# Patient Record
Sex: Male | Born: 1980 | Race: White | Hispanic: No | Marital: Single | Smoking: Current every day smoker
Health system: Southern US, Community
[De-identification: ages and names within clinical notes are randomized; demographics above are authoritative.]

## PROBLEM LIST (undated history)

## (undated) DIAGNOSIS — F32A Depression, unspecified: Secondary | ICD-10-CM

## (undated) DIAGNOSIS — N289 Disorder of kidney and ureter, unspecified: Secondary | ICD-10-CM

## (undated) DIAGNOSIS — F329 Major depressive disorder, single episode, unspecified: Secondary | ICD-10-CM

## (undated) DIAGNOSIS — M25519 Pain in unspecified shoulder: Secondary | ICD-10-CM

## (undated) HISTORY — PX: NO PAST SURGERIES: SHX2092

---

## 2006-04-10 ENCOUNTER — Emergency Department: Payer: Self-pay | Admitting: Emergency Medicine

## 2006-07-17 ENCOUNTER — Emergency Department: Payer: Self-pay | Admitting: Emergency Medicine

## 2006-10-05 ENCOUNTER — Emergency Department: Payer: Self-pay | Admitting: Emergency Medicine

## 2006-10-07 ENCOUNTER — Emergency Department: Payer: Self-pay | Admitting: Emergency Medicine

## 2013-04-09 ENCOUNTER — Emergency Department: Payer: Self-pay

## 2013-04-09 LAB — CBC
HCT: 45.3 % (ref 40.0–52.0)
HGB: 15.4 g/dL (ref 13.0–18.0)
MCH: 32.7 pg (ref 26.0–34.0)
MCHC: 34 g/dL (ref 32.0–36.0)
MCV: 96 fL (ref 80–100)
Platelet: 146 10*3/uL — ABNORMAL LOW (ref 150–440)
RBC: 4.71 10*6/uL (ref 4.40–5.90)

## 2013-04-09 LAB — COMPREHENSIVE METABOLIC PANEL
Alkaline Phosphatase: 87 U/L (ref 50–136)
BUN: 12 mg/dL (ref 7–18)
Calcium, Total: 8.8 mg/dL (ref 8.5–10.1)
Co2: 29 mmol/L (ref 21–32)
EGFR (African American): >60
Glucose: 98 mg/dL (ref 65–99)
Osmolality: 285 (ref 275–301)
SGPT (ALT): 20 U/L (ref 12–78)
Sodium: 143 mmol/L (ref 136–145)

## 2013-04-09 LAB — ETHANOL: Ethanol: 165 mg/dL

## 2013-04-10 LAB — DRUG SCREEN, URINE
Amphetamines, Ur Screen: NEGATIVE (ref ?–1000)
Barbiturates, Ur Screen: NEGATIVE (ref ?–200)
Benzodiazepine, Ur Scrn: NEGATIVE (ref ?–200)
Cocaine Metabolite,Ur ~~LOC~~: POSITIVE (ref ?–300)
Methadone, Ur Screen: NEGATIVE (ref ?–300)
Opiate, Ur Screen: POSITIVE (ref ?–300)
Phencyclidine (PCP) Ur S: NEGATIVE (ref ?–25)

## 2013-04-10 LAB — URINALYSIS, COMPLETE
Bacteria: NONE SEEN
Bilirubin,UR: NEGATIVE
Glucose,UR: NEGATIVE mg/dL (ref 0–75)
Ketone: NEGATIVE
Leukocyte Esterase: NEGATIVE
Ph: 7 (ref 4.5–8.0)
Protein: NEGATIVE
Squamous Epithelial: NONE SEEN

## 2013-05-07 ENCOUNTER — Emergency Department: Payer: Self-pay | Admitting: Emergency Medicine

## 2013-05-10 LAB — BETA STREP CULTURE(ARMC)

## 2013-05-20 ENCOUNTER — Emergency Department: Payer: Self-pay | Admitting: Internal Medicine

## 2013-12-13 ENCOUNTER — Emergency Department: Payer: Self-pay | Admitting: Emergency Medicine

## 2014-01-21 ENCOUNTER — Emergency Department: Payer: Self-pay | Admitting: Emergency Medicine

## 2014-01-21 LAB — CBC WITH DIFFERENTIAL/PLATELET
Basophil #: 0 10*3/uL (ref 0.0–0.1)
Basophil %: 0.9 %
EOS PCT: 2.6 %
Eosinophil #: 0.1 10*3/uL (ref 0.0–0.7)
HCT: 42.7 % (ref 40.0–52.0)
HGB: 14.3 g/dL (ref 13.0–18.0)
Lymphocyte #: 1.8 10*3/uL (ref 1.0–3.6)
Lymphocyte %: 37 %
MCH: 31.7 pg (ref 26.0–34.0)
MCHC: 33.4 g/dL (ref 32.0–36.0)
MCV: 95 fL (ref 80–100)
MONO ABS: 0.4 x10 3/mm (ref 0.2–1.0)
Monocyte %: 7.8 %
NEUTROS PCT: 51.7 %
Neutrophil #: 2.5 10*3/uL (ref 1.4–6.5)
PLATELETS: 145 10*3/uL — AB (ref 150–440)
RBC: 4.5 10*6/uL (ref 4.40–5.90)
RDW: 14 % (ref 11.5–14.5)
WBC: 4.8 10*3/uL (ref 3.8–10.6)

## 2014-01-21 LAB — COMPREHENSIVE METABOLIC PANEL
ALK PHOS: 102 U/L
ANION GAP: 3 — AB (ref 7–16)
Albumin: 4 g/dL (ref 3.4–5.0)
BILIRUBIN TOTAL: 0.7 mg/dL (ref 0.2–1.0)
BUN: 12 mg/dL (ref 7–18)
CO2: 28 mmol/L (ref 21–32)
Calcium, Total: 9.3 mg/dL (ref 8.5–10.1)
Chloride: 107 mmol/L (ref 98–107)
Creatinine: 0.97 mg/dL (ref 0.60–1.30)
EGFR (Non-African Amer.): >60
GLUCOSE: 99 mg/dL (ref 65–99)
OSMOLALITY: 275 (ref 275–301)
Potassium: 3.9 mmol/L (ref 3.5–5.1)
SGOT(AST): 20 U/L (ref 15–37)
SGPT (ALT): 26 U/L
Sodium: 138 mmol/L (ref 136–145)
TOTAL PROTEIN: 7.5 g/dL (ref 6.4–8.2)

## 2014-01-21 LAB — LIPASE, BLOOD: Lipase: 82 U/L (ref 73–393)

## 2014-04-09 ENCOUNTER — Emergency Department: Payer: Self-pay | Admitting: Internal Medicine

## 2014-05-29 ENCOUNTER — Emergency Department: Payer: Self-pay | Admitting: Emergency Medicine

## 2014-07-01 ENCOUNTER — Emergency Department: Payer: Self-pay | Admitting: Emergency Medicine

## 2014-10-04 NOTE — Consult Note (Signed)
PATIENT NAME:  [name] Griffin, [name] MR#:  [id number] DATE OF BIRTH:  August 09, 1980  DATE OF CONSULTATION:  04/10/2013  REFERRING PHYSICIAN:   CONSULTING PHYSICIAN:  Josef Tourigny S. Chozen Latulippe, MD  REASON FOR CONSULTATION: I fell off the roof, I want to go home.   HISTORY OF PRESENT ILLNESS: The patient is a 34 year old male who presented to the ED after he fell off the roof and stated that he does not want to hurt himself or anybody else. He was brought in to the ED by the EMS at 8:00 p.m. last night. Per the patient, he was working on roofing and siding and then he fell off the roof yesterday at work. He reported that he went to the restaurant and got loaded with his cousin. He claims that he might have had a seizure or fell at home. His family called 911. At presentation the patient was intoxicated with a BAL of 165. He was also positive for cocaine, opioids and THC. He was very irritated during my interview and reported that he has already spoken to the ED physician and does not want to talk to me anymore. He stated "this is F bullshit". He stated that "you cannot hold me here and this is pissing me off.  I had a seizure as I got drunk and I cannot stay here any longer." He stated that he just wanted to leave the hospital. He was very irritable and did not participate much in the interview. Most of the history is obtained from the records.   According to the history, the patient currently works in roofing and siding and he mentioned to the intake person that he cannot lose his job as he has 4 children to support. He also has history of overdose as a teen and [facility]. He is not seeing a psychiatrist or a therapist. The patient has demonstrated poor insight and was very irritable upon presentation to the ED and has to be medicated to control his behavior.   PAST PSYCHIATRIC HISTORY: The patient has long history of using drugs and alcohol. He started using alcohol and has been using margaritas and  beer. He usually consumes a 6 pack over the weekend. It is not known if the patient has any history of blackouts. However, the patient had a seizure which led to this admission. He also uses cocaine as well as marijuana. He was minimizing his use of drugs at this time.   PAST MEDICAL HISTORY: The patient has history of seizure disorder.   CURRENT MEDICATIONS: Percocet.   SOCIAL HISTORY: The patient currently lives with his family. He has 3 children. He currently works in [employer]ofing and siding.   ALLERGIES: SULFA DRUGS.   REVIEW OF SYSTEMS: CONSTITUTIONAL: Does not have any fever or chills. No weight changes. He did not participate much in the review of systems.   VITAL SIGNS: Temperature 97.8, pulse 89, respirations 18, blood pressure 120/59.  LABORATORY DATA: Glucose 98, BUN 12, creatinine 1.02, sodium 143, potassium 4.2, chloride 110, bicarbonate 29, anion gap 4, osmolality 285. Blood alcohol level 165. Protein 7.5, albumin 4.5, bilirubin 0.4, alkaline phosphatase 87, AST 23, ALT 20. UDS positive for cocaine, cannabinoids and opioids. WBC 11.0, RBC 4.71, hemoglobin 15.4, hematocrit 45.3, platelet count 146.   MENTAL STATUS EXAMINATION: The patient is a thinly built male who appeared his stated age. He was very agitated and upset during the interview. He did not participate much. He maintained poor eye contact. His speech  was rapid. Thought process was tangential. Thought content was nondelusional. He is unable to contract for safety at this time. He demonstrated poor insight and judgment.   DIAGNOSTIC IMPRESSION: AXIS I: Polysubstance dependence.  AXIS II: Personality disorder, not otherwise specified.  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  TREATMENT PLAN: The patient is currently under involuntary commitment. He will be referred to [facility] for substance abuse issues. He will be monitored closely in the Emergency Room at this time. He will be started on medications including  Librium 50 mg p.o. 1 time dose for his alcohol withdrawn and then will be started on 25 mg p.o. q. 6 hours for 3 days. He will also be given trazodone 100 mg at bedtime as well as other medications to help with withdrawal from the opioids.   Thank you for allowing me to participate in the care of this patient.  ____________________________ Ardeen Fillers. Garnetta Buddy, MD usf:sb D: 04/10/2013 14:24:34 ET T: 04/10/2013 14:43:32 ET JOB#: [id number]  cc: Ardeen Fillers. Garnetta Buddy, MD, <Dictator> Rhunette Croft MD ELECTRONICALLY SIGNED 04/19/2013 13:41

## 2015-01-09 ENCOUNTER — Encounter: Payer: Self-pay | Admitting: *Deleted

## 2015-01-09 ENCOUNTER — Emergency Department
Admission: EM | Admit: 2015-01-09 | Discharge: 2015-01-09 | Disposition: A | Discharge status: Home / Self Care | Payer: Self-pay | Attending: Emergency Medicine | Admitting: Emergency Medicine

## 2015-01-09 DIAGNOSIS — Z720 Tobacco use: Secondary | ICD-10-CM | POA: Insufficient documentation

## 2015-01-09 DIAGNOSIS — Y990 Civilian activity done for income or pay: Secondary | ICD-10-CM | POA: Insufficient documentation

## 2015-01-09 DIAGNOSIS — Y9289 Other specified places as the place of occurrence of the external cause: Secondary | ICD-10-CM | POA: Insufficient documentation

## 2015-01-09 DIAGNOSIS — T679XXA Effect of heat and light, unspecified, initial encounter: Secondary | ICD-10-CM | POA: Insufficient documentation

## 2015-01-09 DIAGNOSIS — Y9389 Activity, other specified: Secondary | ICD-10-CM | POA: Insufficient documentation

## 2015-01-09 DIAGNOSIS — E860 Dehydration: Secondary | ICD-10-CM | POA: Insufficient documentation

## 2015-01-09 DIAGNOSIS — X30XXXA Exposure to excessive natural heat, initial encounter: Secondary | ICD-10-CM | POA: Insufficient documentation

## 2015-01-09 LAB — BASIC METABOLIC PANEL
Anion gap: 13 (ref 5–15)
BUN: 19 mg/dL (ref 6–20)
CALCIUM: 9.7 mg/dL (ref 8.9–10.3)
CO2: 20 mmol/L — ABNORMAL LOW (ref 22–32)
CREATININE: 0.85 mg/dL (ref 0.61–1.24)
Chloride: 101 mmol/L (ref 101–111)
Glucose, Bld: 122 mg/dL — ABNORMAL HIGH (ref 65–99)
Potassium: 3.2 mmol/L — ABNORMAL LOW (ref 3.5–5.1)
SODIUM: 134 mmol/L — AB (ref 135–145)

## 2015-01-09 LAB — CBC
HCT: 42.9 % (ref 40.0–52.0)
HEMOGLOBIN: 14.5 g/dL (ref 13.0–18.0)
MCH: 31.1 pg (ref 26.0–34.0)
MCHC: 33.8 g/dL (ref 32.0–36.0)
MCV: 92.2 fL (ref 80.0–100.0)
Platelets: 156 10*3/uL (ref 150–440)
RBC: 4.65 MIL/uL (ref 4.40–5.90)
RDW: 13.6 % (ref 11.5–14.5)
WBC: 9.2 10*3/uL (ref 3.8–10.6)

## 2015-01-09 MED ORDER — SODIUM CHLORIDE 0.9 % IV SOLN
1000.0000 mL | INTRAVENOUS | Status: DC
  Administered 2015-01-09: 1000 mL via INTRAVENOUS

## 2015-01-09 NOTE — Discharge Instructions (Signed)
Please seek medical attention for any high fevers, chest pain, shortness of breath, change in behavior, persistent vomiting, bloody stool or any other new or concerning symptoms.  Dehydration, Adult Dehydration is when you lose more fluids from the body than you take in. Vital organs like the kidneys, brain, and heart cannot function without a proper amount of fluids and salt. Any loss of fluids from the body can cause dehydration.  CAUSES   Vomiting.  Diarrhea.  Excessive sweating.  Excessive urine output.  Fever. SYMPTOMS  Mild dehydration  Thirst.  Dry lips.  Slightly dry mouth. Moderate dehydration  Very dry mouth.  Sunken eyes.  Skin does not bounce back quickly when lightly pinched and released.  Dark urine and decreased urine production.  Decreased tear production.  Headache. Severe dehydration  Very dry mouth.  Extreme thirst.  Rapid, weak pulse (more than 100 beats per minute at rest).  Cold hands and feet.  Not able to sweat in spite of heat and temperature.  Rapid breathing.  Blue lips.  Confusion and lethargy.  Difficulty being awakened.  Minimal urine production.  No tears. DIAGNOSIS  Your caregiver will diagnose dehydration based on your symptoms and your exam. Blood and urine tests will help confirm the diagnosis. The diagnostic evaluation should also identify the cause of dehydration. TREATMENT  Treatment of mild or moderate dehydration can often be done at home by increasing the amount of fluids that you drink. It is best to drink small amounts of fluid more often. Drinking too much at one time can make vomiting worse. Refer to the home care instructions below. Severe dehydration needs to be treated at the hospital where you will probably be given intravenous (IV) fluids that contain water and electrolytes. HOME CARE INSTRUCTIONS   Ask your caregiver about specific rehydration instructions.  Drink enough fluids to keep your urine  clear or pale yellow.  Drink small amounts frequently if you have nausea and vomiting.  Eat as you normally do.  Avoid:  Foods or drinks high in sugar.  Carbonated drinks.  Juice.  Extremely hot or cold fluids.  Drinks with caffeine.  Fatty, greasy foods.  Alcohol.  Tobacco.  Overeating.  Gelatin desserts.  Wash your hands well to avoid spreading bacteria and viruses.  Only take over-the-counter or prescription medicines for pain, discomfort, or fever as directed by your caregiver.  Ask your caregiver if you should continue all prescribed and over-the-counter medicines.  Keep all follow-up appointments with your caregiver. SEEK MEDICAL CARE IF:  You have abdominal pain and it increases or stays in one area (localizes).  You have a rash, stiff neck, or severe headache.  You are irritable, sleepy, or difficult to awaken.  You are weak, dizzy, or extremely thirsty. SEEK IMMEDIATE MEDICAL CARE IF:   You are unable to keep fluids down or you get worse despite treatment.  You have frequent episodes of vomiting or diarrhea.  You have blood or green matter (bile) in your vomit.  You have blood in your stool or your stool looks black and tarry.  You have not urinated in 6 to 8 hours, or you have only urinated a small amount of very dark urine.  You have a fever.  You faint. MAKE SURE YOU:   Understand these instructions.  Will watch your condition.  Will get help right away if you are not doing well or get worse. Document Released: 05/31/2005 Document Revised: 08/23/2011 Document Reviewed: 01/18/2011 [facility] ([facility]ExitCare Patient Information 2015 [place], [place].  This information is not intended to replace advice given to you by your health care provider. Make sure you discuss any questions you have with your health care provider.  Heat-Related Illness Heat-related illnesses occur when the body is unable to properly cool itself. The body normally cools itself by  sweating. However, under some conditions sweating is not enough. In these cases, a person's body temperature rises rapidly. Very high body temperatures may damage the brain or other vital organs. Some examples of heat-related illnesses include:  Heat stroke. This occurs when the body is unable to regulate its temperature. The body's temperature rises rapidly, the sweating mechanism fails, and the body is unable to cool down. Body temperature may rise to 106 F (41 C) or higher within 10 to 15 minutes. Heat stroke can cause death or permanent disability if emergency treatment is not provided.  Heat exhaustion. This is a milder form of heat-related illness that can develop after several days of exposure to high temperatures and not enough fluids. It is the body's response to an excessive loss of the water and salt contained in sweat.  Heat cramps. These usually affect people who sweat a lot during heavy activity. This sweating drains the body's salt and moisture. The low salt level in the muscles causes painful cramps. Heat cramps may also be a symptom of heat exhaustion. Heat cramps usually occur in the abdomen, arms, or legs. Get medical attention for cramps if you have heart problems or are on a low-sodium diet. Those that are at greatest risk for heat-related illnesses include:   The elderly.  Infant and the very young.  People with mental illness and chronic diseases.  People who are overweight (obese).  Young and healthy people can even succumb to heat if they participate in strenuous physical activities during hot weather. CAUSES  Several factors affect the body's ability to cool itself during extremely hot weather. When the humidity is high, sweat will not evaporate as quickly. This prevents the body from releasing heat quickly. Other factors that can affect the body's ability to cool down include:   Age.  Obesity.  Fever.  Dehydration.  Heart disease.  Mental illness.  Poor  circulation.  Sunburn.  Prescription drug use.  Alcohol use. SYMPTOMS  Heat stroke: Warning signs of heat stroke vary, but may include:  An extremely high body temperature (above 103F orally).  A fast, strong pulse.  Dizziness.  Confusion.  Red, hot, and dry skin.  No sweating.  Throbbing headache.  Feeling sick to your stomach (nauseous).  Unconsciousness. Heat exhaustion: Warning signs of heat exhaustion include:  Heavy sweating.  Tiredness.  Headache.  Paleness.  Weakness.  Feeling sick to your stomach (nauseous) or vomiting.  Muscle cramps. Heat cramps  Muscle pains or spasms. TREATMENT  Heat stroke  Get into a cool environment. An indoor place that is air-conditioned may be best.  Take a cool shower or bath. Have someone around to make sure you are okay.  Take your temperature. Make sure it is going down. Heat exhaustion  Drink plenty of fluids. Do not drink liquids that contain caffeine, alcohol, or large amounts of sugar. These cause you to lose more body fluid. Also, avoid very cold drinks. They can cause stomach cramps.  Get into a cool environment. An indoor place that is air-conditioned may be best.  Take a cool shower or bath. Have someone around to make sure you are okay.  Put on lightweight clothing. Heat cramps  Stop  whatever activity you were doing. Do not attempt to do that activity for at least 3 hours after the cramps have gone away.  Get into a cool environment. An indoor place that is air-conditioned may be best. HOME CARE INSTRUCTIONS  To protect your health when temperatures are extremely high, follow these tips:  During heavy exercise in a hot environment, drink two to four glasses (16-32 ounces) of cool fluids each hour. Do not wait until you are thirsty to drink. Warning: If your caregiver limits the amount of fluid you drink or has you on water pills, ask how much you should drink while the weather is hot.  Do not  drink liquids that contain caffeine, alcohol, or large amounts of sugar. These cause you to lose more body fluid.  Avoid very cold drinks. They can cause stomach cramps.  Wear appropriate clothing. Choose lightweight, light-colored, loose-fitting clothing.  If you must be outdoors, try to limit your outdoor activity to morning and evening hours. Try to rest often in shady areas.  If you are not used to working or exercising in a hot environment, start slowly and pick up the pace gradually.  Stay cool in an air-conditioned place if possible. If your home does not have air conditioning, go to the shopping mall or [employer].  Taking a cool shower or bath may help you cool off. SEEK MEDICAL CARE IF:   You see any of the symptoms listed above. You may be dealing with a life-threatening emergency.  Symptoms worsen or last longer than 1 hour.  Heat cramps do not get better in 1 hour. MAKE SURE YOU:   Understand these instructions.  Will watch your condition.  Will get help right away if you are not doing well or get worse. Document Released: 03/09/2008 Document Revised: 08/23/2011 Document Reviewed: 03/09/2008 [facility] Patient Information 2015 [place], [place]. This information is not intended to replace advice given to you by your health care provider. Make sure you discuss any questions you have with your health care provider.

## 2015-01-09 NOTE — ED Notes (Signed)
Pt resting in chair in subwait

## 2015-01-09 NOTE — ED Provider Notes (Signed)
[facility] Emergency Department Provider Note    ____________________________________________  Time seen: 1635  I have reviewed the triage vital signs and the nursing notes.   HISTORY  Chief Complaint Near Syncope   History limited by: Not Limited   HPI [name] Griffin is a 34 y.o. male who presented to the emergency department today because of concerns for a syncopal episode and heat exposure. 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 He states he tried to drink enough fluids. He states he started feeling bad and he noticed his hands cramping up on him. Additionally he felt like he was having a hard time breathing. States he went into his truck and did faint. Now that he is confluence he states it feels a lot better. His only complaint now is been hungry. He denies any significant medical history.     History reviewed. No pertinent past medical history.  There are no active problems to display for this patient.   History reviewed. No pertinent past surgical history.  No current outpatient prescriptions on file.  Allergies Sulfa antibiotics  No family history on file.  Social History History  Substance Use Topics  . Smoking status: Current Every Day Smoker  . Smokeless tobacco: Not on file  . Alcohol Use: No    Review of Systems  Constitutional: Negative for fever. Cardiovascular: Negative for chest pain. Respiratory: Negative for shortness of breath. Gastrointestinal: Negative for abdominal pain, vomiting and diarrhea. Genitourinary: Negative for dysuria. Musculoskeletal: Negative for back pain. Skin: Negative for rash. Neurological: Negative for headaches, focal weakness or numbness.  10-point ROS otherwise negative.  ____________________________________________   PHYSICAL EXAM:  VITAL SIGNS: ED Triage Vitals  Enc Vitals Group     BP 01/09/15 1506 118/75 mmHg     Pulse Rate  01/09/15 1506 95     Resp 01/09/15 1506 24     Temp 01/09/15 1506 97.9 F (36.6 C)     Temp Source 01/09/15 1506 Oral     SpO2 01/09/15 1506 100 %     Weight 01/09/15 1506 135 lb (61.236 kg)     Height 01/09/15 1506 5\' 11"  (1.803 m)   Constitutional: Alert and oriented. Well appearing and in no distress. Eyes: Conjunctivae are normal. PERRL. Normal extraocular movements. ENT   Head: Normocephalic and atraumatic.   Nose: No congestion/rhinnorhea.   Mouth/Throat: Mucous membranes are moist.   Neck: No stridor. Hematological/Lymphatic/Immunilogical: No cervical lymphadenopathy. Cardiovascular: Normal rate, regular rhythm.  No murmurs, rubs, or gallops. Respiratory: Normal respiratory effort without tachypnea nor retractions. Breath sounds are clear and equal bilaterally. No wheezes/rales/rhonchi. Gastrointestinal: Soft and nontender. No distention. There is no CVA tenderness. Genitourinary: Deferred Musculoskeletal: Normal range of motion in all extremities. No joint effusions.  No lower extremity tenderness nor edema. Neurologic:  Normal speech and language. No gross focal neurologic deficits are appreciated. Speech is normal.  Skin:  Skin is warm, dry and intact. No rash noted. Psychiatric: Mood and affect are normal. Speech and behavior are normal. Patient exhibits appropriate insight and judgment.  ____________________________________________    LABS (pertinent positives/negatives)  Labs Reviewed  BASIC METABOLIC PANEL - Abnormal; Notable for the following:    Sodium 134 (*)    Potassium 3.2 (*)    CO2 20 (*)    Glucose, Bld 122 (*)    All other components within normal limits  CBC     ____________________________________________   EKG  None  ____________________________________________    RADIOLOGY  None  ____________________________________________   PROCEDURES  Procedure(s) performed: None  Critical Care performed:  No  ____________________________________________   INITIAL IMPRESSION / ASSESSMENT AND PLAN / ED COURSE  Pertinent labs & imaging results that were available during my care of the patient were reviewed by me and considered in my medical decision making (see chart for details).  Patient presents to the emergency department after a syncopal episode cramping, concerns for dehydration and heat exhaustion. Patient now states he feels much better after being inside and getting fluid bolus. No concerning  findings on physical exam. Given the clinical history I do think the patient likely suffered from heat exposure and dehydration. I discussed with patient importance of good hydration and taking breaks whilst working in the heat.  ____________________________________________   FINAL CLINICAL IMPRESSION(S) / ED DIAGNOSES  Dehydration.  Heat exposure  Phineas Semen, MD 01/09/15 [phone]

## 2015-01-09 NOTE — ED Notes (Signed)
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

## 2016-10-07 ENCOUNTER — Emergency Department
Admission: EM | Admit: 2016-10-07 | Discharge: 2016-10-07 | Disposition: A | Discharge status: Home / Self Care | Payer: Medicaid Other | Attending: Emergency Medicine | Admitting: Emergency Medicine

## 2016-10-07 ENCOUNTER — Encounter: Payer: Self-pay | Admitting: Medical Oncology

## 2016-10-07 DIAGNOSIS — F17200 Nicotine dependence, unspecified, uncomplicated: Secondary | ICD-10-CM | POA: Insufficient documentation

## 2016-10-07 DIAGNOSIS — F329 Major depressive disorder, single episode, unspecified: Secondary | ICD-10-CM | POA: Diagnosis present

## 2016-10-07 DIAGNOSIS — F341 Dysthymic disorder: Secondary | ICD-10-CM | POA: Diagnosis not present

## 2016-10-07 DIAGNOSIS — F419 Anxiety disorder, unspecified: Secondary | ICD-10-CM | POA: Insufficient documentation

## 2016-10-07 HISTORY — DX: Major depressive disorder, single episode, unspecified: F32.9

## 2016-10-07 HISTORY — DX: Depression, unspecified: F32.A

## 2016-10-07 LAB — COMPREHENSIVE METABOLIC PANEL
ALK PHOS: 60 U/L (ref 38–126)
ALT: 11 U/L — AB (ref 17–63)
AST: 21 U/L (ref 15–41)
Albumin: 4.3 g/dL (ref 3.5–5.0)
Anion gap: 7 (ref 5–15)
BILIRUBIN TOTAL: 0.7 mg/dL (ref 0.3–1.2)
BUN: 15 mg/dL (ref 6–20)
CALCIUM: 9 mg/dL (ref 8.9–10.3)
CO2: 27 mmol/L (ref 22–32)
CREATININE: 1.14 mg/dL (ref 0.61–1.24)
Chloride: 104 mmol/L (ref 101–111)
GFR calc Af Amer: >60 mL/min (ref >60)
GFR calc non Af Amer: >60 mL/min (ref >60)
Glucose, Bld: 123 mg/dL — ABNORMAL HIGH (ref 65–99)
POTASSIUM: 3.2 mmol/L — AB (ref 3.5–5.1)
Sodium: 138 mmol/L (ref 135–145)
TOTAL PROTEIN: 6.5 g/dL (ref 6.5–8.1)

## 2016-10-07 LAB — CBC
HEMATOCRIT: 41.7 % (ref 40.0–52.0)
Hemoglobin: 14.1 g/dL (ref 13.0–18.0)
MCH: 31.6 pg (ref 26.0–34.0)
MCHC: 33.9 g/dL (ref 32.0–36.0)
MCV: 93.2 fL (ref 80.0–100.0)
Platelets: 137 10*3/uL — ABNORMAL LOW (ref 150–440)
RBC: 4.47 MIL/uL (ref 4.40–5.90)
RDW: 13.3 % (ref 11.5–14.5)
WBC: 7.3 10*3/uL (ref 3.8–10.6)

## 2016-10-07 LAB — SALICYLATE LEVEL: Salicylate Lvl: <7 mg/dL (ref 2.8–30.0)

## 2016-10-07 LAB — ETHANOL: Alcohol, Ethyl (B): <5 mg/dL (ref <5)

## 2016-10-07 LAB — ACETAMINOPHEN LEVEL: Acetaminophen (Tylenol), Serum: <10 ug/mL — ABNORMAL LOW (ref 10–30)

## 2016-10-07 MED ORDER — QUETIAPINE FUMARATE ER 50 MG PO TB24: 50.0000 mg | ORAL_TABLET | Freq: Every day | ORAL | 0 refills | Status: DC

## 2016-10-07 MED ORDER — POTASSIUM CHLORIDE CRYS ER 20 MEQ PO TBCR
40.0000 meq | EXTENDED_RELEASE_TABLET | Freq: Once | ORAL | Status: AC
  Administered 2016-10-07: 40 meq via ORAL
  Filled 2016-10-07: qty 2

## 2016-10-07 MED ORDER — CLONAZEPAM 0.5 MG PO TABS: 0.5000 mg | ORAL_TABLET | Freq: Two times a day (BID) | ORAL | 0 refills | Status: DC | PRN

## 2016-10-07 NOTE — ED Triage Notes (Signed)
Pt reports that he has a lot on his mind, a lot of family issues going on that is causing him to feel depressed. Pt is wanting help for the depression. Denies SI/HI. Pt cooperative.

## 2016-10-07 NOTE — Discharge Instructions (Signed)
Take seroquel 50 mg daily.   Take klonopin as needed for anxiety.   See your counselor. You need to follow up with a psychiatrist.   Return to ER if you have thoughts of harming yourself or others, hallucinations, panic attacks.

## 2016-10-07 NOTE — ED Provider Notes (Signed)
[facility]-EMERGENCY DEPARTMENT Provider Note   CSN: [id number] Arrival date & time: 10/07/16  1703     History   Chief Complaint Chief Complaint  Patient presents with  . Depression    HPI [name] Griffin is a 36 y.o. male hx of depression, here presenting with depression, anxiety. Has been very depressed for the last month or so. Has been having poor appetite and sleeping less. He just had custody of his 4 kids and his partner left him. He has been having panic attacks during the day. He denies suicidal or homicidal ideations. Denies hallucinations. Patient was on seroquel and klonopin before. Went to [facility] and saw a Veterinary surgeon. Came here for requesting meds for depression, anxiety.    The history is provided by the patient.    Past Medical History:  Diagnosis Date  . Depression     There are no active problems to display for this patient.   No past surgical history on file.     Home Medications    Prior to Admission medications   Not on File    Family History No family history on file.  Social History Social History  Substance Use Topics  . Smoking status: Current Every Day Smoker  . Smokeless tobacco: Not on file  . Alcohol use No     Allergies   Sulfa antibiotics   Review of Systems Review of Systems  Psychiatric/Behavioral: Positive for depression and dysphoric mood.  All other systems reviewed and are negative.    Physical Exam Updated Vital Signs BP (!) 140/96 (BP Location: Right Arm)   Pulse 79   Temp 98.1 F (36.7 C) (Oral)   Resp 18   Wt 135 lb (61.2 kg)   SpO2 99%   BMI 18.83 kg/m   Physical Exam  Constitutional: He is oriented to person, place, and time. He appears well-developed and well-nourished.  HENT:  Head: Normocephalic.  Mouth/Throat: Oropharynx is clear and moist.  Eyes: EOM are normal. Pupils are equal, round, and reactive to light.  Neck: Normal range of motion. Neck supple.  Cardiovascular: Normal rate, regular  rhythm and normal heart sounds.   Pulmonary/Chest: Effort normal and breath sounds normal. No respiratory distress. He has no wheezes. He has no rales.  Abdominal: Soft. Bowel sounds are normal. He exhibits no distension. There is no tenderness. There is no guarding.  Musculoskeletal: Normal range of motion.  Neurological: He is alert and oriented to person, place, and time. He displays normal reflexes. No cranial nerve deficit. Coordination normal.  Skin: Skin is warm.  Psychiatric:  Depressed, not suicidal.   Nursing note and vitals reviewed.    ED Treatments / Results  Labs (all labs ordered are listed, but only abnormal results are displayed) Labs Reviewed  COMPREHENSIVE METABOLIC PANEL - Abnormal; Notable for the following:       Result Value   Potassium 3.2 (*)    Glucose, Bld 123 (*)    ALT 11 (*)    All other components within normal limits  ACETAMINOPHEN LEVEL - Abnormal; Notable for the following:    Acetaminophen (Tylenol), Serum <10 (*)    All other components within normal limits  CBC - Abnormal; Notable for the following:    Platelets 137 (*)    All other components within normal limits  ETHANOL  SALICYLATE LEVEL  URINE DRUG SCREEN, QUALITATIVE ([facility] ONLY)    EKG  EKG Interpretation None       Radiology No results found.  Procedures Procedures (  including critical care time)  Medications Ordered in ED Medications  potassium chloride SA (K-DUR,KLOR-CON) CR tablet 40 mEq (not administered)     Initial Impression / Assessment and Plan / ED Course  I have reviewed the triage vital signs and the nursing notes.  Pertinent labs & imaging results that were available during my care of the patient were reviewed by me and considered in my medical decision making (see chart for details).     [name] Griffin is a 36 y.o. male here with depression, anxiety. Denies suicidal or homicidal ideation. Labs showed K 3.2, supplemented. ETOH neg. He is currently seeing a  Veterinary surgeon at [facility]. He request refill of his psych meds. He has been off seroquel and klonopin for years. I counseled him regarding black box warning of suicidal ideation and that he needs to return if he has thoughts of harming himself. Will start low dose seroquel 50 mg daily, klonopin 1 mg BID prn. He will need to follow up with psych and get meds adjusted as needed.    Final Clinical Impressions(s) / ED Diagnoses   Final diagnoses:  None    New Prescriptions New Prescriptions   No medications on file     Charlynne Pander, MD 10/07/16 [phone]

## 2016-10-07 NOTE — ED Notes (Signed)
Patient given a specimen cup to collect a urine sample when patient can go.

## 2016-10-07 NOTE — ED Notes (Signed)
Spoke with Dr Silverio Lay about pt. Pt is denying SI/HI. Pt is completely rational. Labs to be drawn and okay if pt waits in lobby for room.

## 2016-10-19 ENCOUNTER — Other Ambulatory Visit: Payer: Self-pay | Admitting: Orthopedic Surgery

## 2016-10-19 DIAGNOSIS — M24412 Recurrent dislocation, left shoulder: Secondary | ICD-10-CM

## 2016-11-01 ENCOUNTER — Ambulatory Visit
Admission: RE | Admit: 2016-11-01 | Discharge: 2016-11-01 | Disposition: A | Discharge status: Home / Self Care | Payer: Medicaid Other | Source: Ambulatory Visit | Attending: Orthopedic Surgery | Admitting: Orthopedic Surgery

## 2016-11-01 DIAGNOSIS — M24412 Recurrent dislocation, left shoulder: Secondary | ICD-10-CM | POA: Diagnosis present

## 2016-11-01 DIAGNOSIS — M25519 Pain in unspecified shoulder: Secondary | ICD-10-CM | POA: Insufficient documentation

## 2016-11-01 MED ORDER — GADOBENATE DIMEGLUMINE 529 MG/ML IV SOLN
5.0000 mL | Freq: Once | INTRAVENOUS | Status: AC | PRN
  Administered 2016-11-01: 0.01 mL via INTRA_ARTICULAR

## 2016-11-01 MED ORDER — IOPAMIDOL (ISOVUE-200) INJECTION 41%
50.0000 mL | Freq: Once | INTRAVENOUS | Status: AC
  Administered 2016-11-01: 15 mL via INTRA_ARTICULAR
  Filled 2016-11-01: qty 50

## 2016-11-01 MED ORDER — LIDOCAINE HCL (PF) 1 % IJ SOLN
30.0000 mL | Freq: Once | INTRAMUSCULAR | Status: AC
  Administered 2016-11-01: 10 mL
  Filled 2016-11-01: qty 30

## 2016-11-24 ENCOUNTER — Other Ambulatory Visit: Payer: Self-pay | Admitting: Otolaryngology

## 2016-11-24 DIAGNOSIS — R221 Localized swelling, mass and lump, neck: Secondary | ICD-10-CM

## 2016-12-02 ENCOUNTER — Ambulatory Visit
Admission: RE | Admit: 2016-12-02 | Discharge: 2016-12-02 | Disposition: A | Discharge status: Home / Self Care | Payer: Medicaid Other | Source: Ambulatory Visit | Attending: Otolaryngology | Admitting: Otolaryngology

## 2016-12-02 DIAGNOSIS — R221 Localized swelling, mass and lump, neck: Secondary | ICD-10-CM | POA: Insufficient documentation

## 2016-12-02 MED ORDER — IOPAMIDOL (ISOVUE-300) INJECTION 61%
75.0000 mL | Freq: Once | INTRAVENOUS | Status: AC | PRN
  Administered 2016-12-02: 75 mL via INTRAVENOUS

## 2017-01-26 ENCOUNTER — Emergency Department: Payer: Medicaid Other

## 2017-01-26 ENCOUNTER — Emergency Department
Admission: EM | Admit: 2017-01-26 | Discharge: 2017-01-26 | Disposition: A | Discharge status: Home / Self Care | Payer: Medicaid Other | Attending: Emergency Medicine | Admitting: Emergency Medicine

## 2017-01-26 ENCOUNTER — Encounter: Payer: Self-pay | Admitting: *Deleted

## 2017-01-26 DIAGNOSIS — Y939 Activity, unspecified: Secondary | ICD-10-CM | POA: Diagnosis not present

## 2017-01-26 DIAGNOSIS — Z79899 Other long term (current) drug therapy: Secondary | ICD-10-CM | POA: Insufficient documentation

## 2017-01-26 DIAGNOSIS — F17220 Nicotine dependence, chewing tobacco, uncomplicated: Secondary | ICD-10-CM | POA: Insufficient documentation

## 2017-01-26 DIAGNOSIS — K0889 Other specified disorders of teeth and supporting structures: Secondary | ICD-10-CM | POA: Diagnosis present

## 2017-01-26 DIAGNOSIS — W01190A Fall on same level from slipping, tripping and stumbling with subsequent striking against furniture, initial encounter: Secondary | ICD-10-CM | POA: Diagnosis not present

## 2017-01-26 DIAGNOSIS — S20212A Contusion of left front wall of thorax, initial encounter: Secondary | ICD-10-CM

## 2017-01-26 DIAGNOSIS — F17210 Nicotine dependence, cigarettes, uncomplicated: Secondary | ICD-10-CM | POA: Diagnosis not present

## 2017-01-26 DIAGNOSIS — K029 Dental caries, unspecified: Secondary | ICD-10-CM | POA: Diagnosis not present

## 2017-01-26 DIAGNOSIS — Y999 Unspecified external cause status: Secondary | ICD-10-CM | POA: Insufficient documentation

## 2017-01-26 DIAGNOSIS — K047 Periapical abscess without sinus: Secondary | ICD-10-CM

## 2017-01-26 DIAGNOSIS — Y929 Unspecified place or not applicable: Secondary | ICD-10-CM | POA: Diagnosis not present

## 2017-01-26 LAB — GLUCOSE, CAPILLARY: GLUCOSE-CAPILLARY: 103 mg/dL — AB (ref 65–99)

## 2017-01-26 MED ORDER — LIDOCAINE-EPINEPHRINE 2 %-1:100000 IJ SOLN
1.7000 mL | Freq: Once | INTRAMUSCULAR | Status: AC
  Administered 2017-01-26: 1.7 mL
  Filled 2017-01-26: qty 1.7

## 2017-01-26 MED ORDER — MAGIC MOUTHWASH W/LIDOCAINE: 5.0000 mL | Freq: Four times a day (QID) | ORAL | 0 refills | Status: DC

## 2017-01-26 MED ORDER — IBUPROFEN 800 MG PO TABS: 800.0000 mg | ORAL_TABLET | Freq: Three times a day (TID) | ORAL | 0 refills | Status: DC | PRN

## 2017-01-26 NOTE — ED Provider Notes (Signed)
[facility] Emergency Department Provider Note  ____________________________________________  Time seen: Approximately 9:43 PM  I have reviewed the triage vital signs and the nursing notes.   HISTORY  Chief Complaint Dental Pain and Chest Pain    HPI [name] Griffin is a 36 y.o. male presents to the ED for a complaint of dental pain and L rib pain. Patient reports that dental pain began today and has worsened. He reports he called his dentist about the after hour clinic that is offered and was told there was no availability. He reports swelling and pain to the upper L dentition. No fevers or chills. No difficulty breathing.   Patient later amended story and states he was seen at dentist today and prescribed amoxicillin. He is here for pain control from dental pain.  Patient also reports pain to the L ribcage after falling against a chair two days prior. No SOB. No frank difficulty breathing. No coughing. L lateral rib pain that increases with laughing, coughing, moving, deep breathing.   Past Medical History:  Diagnosis Date  . Depression     There are no active problems to display for this patient.   History reviewed. No pertinent surgical history.  Prior to Admission medications   Medication Sig Start Date End Date Taking? Authorizing Provider  clonazePAM (KLONOPIN) 0.5 MG tablet Take 1 tablet (0.5 mg total) by mouth 2 (two) times daily as needed for anxiety. 10/07/16 10/17/16  Charlynne Pander, MD  ibuprofen (ADVIL,MOTRIN) 800 MG tablet Take 1 tablet (800 mg total) by mouth every 8 (eight) hours as needed. 01/26/17   Cuthriell, Delorise Royals, PA-C  magic mouthwash w/lidocaine SOLN Take 5 mLs by mouth 4 (four) times daily. 01/26/17   Cuthriell, Delorise Royals, PA-C  QUEtiapine (SEROQUEL XR) 50 MG TB24 24 hr tablet Take 1 tablet (50 mg total) by mouth daily. 10/07/16   Charlynne Pander, MD    Allergies Sulfa antibiotics  History reviewed. No pertinent family  history.  Social History Social History  Substance Use Topics  . Smoking status: Current Every Day Smoker    Packs/day: 1.00  . Smokeless tobacco: Current User    Types: Chew  . Alcohol use No     Review of Systems  Constitutional: No fever/chills Eyes: No visual changes. No discharge ENT: Positive for L upper dental pain Cardiovascular: no chest pain. Respiratory: no cough. No SOB. Gastrointestinal: No abdominal pain.  No nausea, no vomiting.  Musculoskeletal: Negative for musculoskeletal pain. Skin: Negative for rash, abrasions, lacerations, ecchymosis. Neurological: Negative for headaches, focal weakness or numbness. 10-point ROS otherwise negative.  ____________________________________________   PHYSICAL EXAM:  VITAL SIGNS: ED Triage Vitals  Enc Vitals Group     BP 01/26/17 2031 126/77     Pulse Rate 01/26/17 2031 88     Resp 01/26/17 2031 16     Temp 01/26/17 2031 97.8 F (36.6 C)     Temp Source 01/26/17 2031 Oral     SpO2 01/26/17 2031 98 %     Weight 01/26/17 2031 135 lb (61.2 kg)     Height 01/26/17 2031 5\' 9"  (1.753 m)     Head Circumference --      Peak Flow --      Pain Score 01/26/17 2030 9     Pain Loc --      Pain Edu? --      Excl. in GC? --      Constitutional: Alert and oriented. Well appearing and in  no acute distress. Eyes: Conjunctivae are normal. PERRL. EOMI. Head: Atraumatic. ENT:      Ears:       Nose: No congestion/rhinnorhea.      Mouth/Throat: Mucous membranes are moist. Mild erythema and edema surrounding third molar left upper dentition. No drainable abscess.No other erythema or edema of the oral cavity. Significant dental erosions and missing dentition throghhout mouth. Neck: No stridor.   Hematological/Lymphatic/Immunilogical: No cervical lymphadenopathy. Cardiovascular: Normal rate, regular rhythm. Normal S1 and S2.  Good peripheral circulation. Respiratory: Normal respiratory effort without tachypnea or retractions. Lungs  CTAB. Good air entry to the bases with no decreased or absent breath sounds. Musculoskeletal: Full range of motion to all extremities. No gross deformities appreciated.no visible deformity or gross edema of the left lateral rib cage. No ecchymosis. No paradoxical chest wall movement. Good underlying breath sounds. Patient is diffusely tender to palpation over the left lateral rib cage. No palpable abnormality. Neurologic:  Normal speech and language. No gross focal neurologic deficits are appreciated.  Skin:  Skin is warm, dry and intact. No rash noted. Psychiatric: Mood and affect are normal. Speech and behavior are normal. Patient exhibits appropriate insight and judgement.   ____________________________________________   LABS (all labs ordered are listed, but only abnormal results are displayed)  Labs Reviewed  GLUCOSE, CAPILLARY - Abnormal; Notable for the following:       Result Value   Glucose-Capillary 103 (*)    All other components within normal limits   ____________________________________________  EKG   ____________________________________________  RADIOLOGY Festus Barren Cuthriell, personally viewed and evaluated these images (plain radiographs) as part of my medical decision making, as well as reviewing the written report by the radiologist.  Dg Ribs Unilateral W/chest Left  Result Date: 01/26/2017 CLINICAL DATA:  Recent fall with left chest pain, initial encounter EXAM: LEFT RIBS AND CHEST - 3+ VIEW COMPARISON:  None. FINDINGS: Cardiac shadow is within normal limits. The lungs are well aerated bilaterally. No pneumothorax or focal infiltrate is seen. No acute rib fracture is noted. IMPRESSION: No acute abnormality seen. Electronically Signed   By: Alcide Clever M.D.   On: 01/26/2017 21:21    ____________________________________________    PROCEDURES  Procedure(s) performed:    Procedures  Digital block is performed using a 27-gauge needle to the posterior  superior alveolar nerve. Nerve injected with 1.7 ml Lidocaine w/ epinephrine. Patient tolerated well with no complications.  Medications  lidocaine-EPINEPHrine (XYLOCAINE W/EPI) 2 %-1:100000 (with pres) injection 1.7 mL (1.7 mLs Infiltration Given 01/26/17 2148)     ____________________________________________   INITIAL IMPRESSION / ASSESSMENT AND PLAN / ED COURSE  Pertinent labs & imaging results that were available during my care of the patient were reviewed by me and considered in my medical decision making (see chart for details).  Review of the  CSRS was performed in accordance of the [facility] prior to dispensing any controlled drugs. Patient has received 7 controlled substances from multiple providers in last 6 months. Patient has multiple documented incidences of extreme rage after not being prescribed narcotics from other providers. Narcotics will not be prescribed for patient.     Patient's diagnosis is consistent with dental abscess and left ribcage contusion. Dental block is performed as described above. Patient will be discharged home with prescriptions for magic mouthwash as patient has already received oral antibiotics. Patient is to follow up with primary care and dentist as needed or otherwise directed. Patient is given ED precautions to return to the ED for  any worsening or new symptoms.     ____________________________________________  FINAL CLINICAL IMPRESSION(S) / ED DIAGNOSES  Final diagnoses:  Dental abscess  Dental caries  Contusion of rib on left side, initial encounter      NEW MEDICATIONS STARTED DURING THIS VISIT:  New Prescriptions   IBUPROFEN (ADVIL,MOTRIN) 800 MG TABLET    Take 1 tablet (800 mg total) by mouth every 8 (eight) hours as needed.   MAGIC MOUTHWASH W/LIDOCAINE SOLN    Take 5 mLs by mouth 4 (four) times daily.        This chart was dictated using voice recognition software/Dragon. Despite best efforts to proofread, errors can occur  which can change the meaning. Any change was purely unintentional.    Racheal PatchesCuthriell, Jonathan D, PA-C 01/26/17 2206    Myrna BlazerSchaevitz, David Matthew, MD 01/26/17 [phone]

## 2017-01-26 NOTE — ED Triage Notes (Signed)
PT reports having left sided dental pain that began suddenly a couple hours ago. Swelling noted to lower jaw line. No SOB reported. Airway intact. PT denies puss from tooth. No fevers at home.   Pt also reports having fallen on left ribs. Pt reports pain when attempting to take a breath.

## 2017-02-03 ENCOUNTER — Emergency Department
Admission: EM | Admit: 2017-02-03 | Discharge: 2017-02-04 | Disposition: A | Discharge status: Home / Self Care | Payer: Medicaid Other | Attending: Student in an Organized Health Care Education/Training Program | Admitting: Student in an Organized Health Care Education/Training Program

## 2017-02-03 ENCOUNTER — Encounter: Payer: Self-pay | Admitting: Emergency Medicine

## 2017-02-03 DIAGNOSIS — W458XXA Other foreign body or object entering through skin, initial encounter: Secondary | ICD-10-CM | POA: Diagnosis not present

## 2017-02-03 DIAGNOSIS — Z79899 Other long term (current) drug therapy: Secondary | ICD-10-CM | POA: Insufficient documentation

## 2017-02-03 DIAGNOSIS — Y939 Activity, unspecified: Secondary | ICD-10-CM | POA: Insufficient documentation

## 2017-02-03 DIAGNOSIS — Y929 Unspecified place or not applicable: Secondary | ICD-10-CM | POA: Diagnosis not present

## 2017-02-03 DIAGNOSIS — T1502XA Foreign body in cornea, left eye, initial encounter: Secondary | ICD-10-CM | POA: Diagnosis not present

## 2017-02-03 DIAGNOSIS — T1592XA Foreign body on external eye, part unspecified, left eye, initial encounter: Secondary | ICD-10-CM | POA: Diagnosis present

## 2017-02-03 DIAGNOSIS — Y999 Unspecified external cause status: Secondary | ICD-10-CM | POA: Diagnosis not present

## 2017-02-03 DIAGNOSIS — F17210 Nicotine dependence, cigarettes, uncomplicated: Secondary | ICD-10-CM | POA: Insufficient documentation

## 2017-02-03 DIAGNOSIS — F17220 Nicotine dependence, chewing tobacco, uncomplicated: Secondary | ICD-10-CM | POA: Diagnosis not present

## 2017-02-03 HISTORY — DX: Disorder of kidney and ureter, unspecified: N28.9

## 2017-02-03 MED ORDER — FLUORESCEIN SODIUM 0.6 MG OP STRP
1.0000 | ORAL_STRIP | Freq: Once | OPHTHALMIC | Status: AC
  Administered 2017-02-03: 1 via OPHTHALMIC
  Filled 2017-02-03: qty 1

## 2017-02-03 MED ORDER — CIPROFLOXACIN HCL 0.3 % OP SOLN: 1.0000 [drp] | OPHTHALMIC | 0 refills | Status: AC

## 2017-02-03 MED ORDER — BUTALBITAL-APAP-CAFFEINE 50-325-40 MG PO TABS
1.0000 | ORAL_TABLET | Freq: Once | ORAL | Status: AC
  Administered 2017-02-04: 1 via ORAL
  Filled 2017-02-03: qty 1

## 2017-02-03 MED ORDER — TETRACAINE HCL 0.5 % OP SOLN
2.0000 [drp] | Freq: Once | OPHTHALMIC | Status: AC
  Administered 2017-02-03: 2 [drp] via OPHTHALMIC
  Filled 2017-02-03: qty 4

## 2017-02-03 MED ORDER — KETOROLAC TROMETHAMINE 0.5 % OP SOLN: 1.0000 [drp] | Freq: Four times a day (QID) | OPHTHALMIC | 0 refills | Status: DC

## 2017-02-03 MED ORDER — DIAZEPAM 5 MG PO TABS
5.0000 mg | ORAL_TABLET | Freq: Once | ORAL | Status: AC
  Administered 2017-02-04: 5 mg via ORAL
  Filled 2017-02-03: qty 1

## 2017-02-03 NOTE — ED Provider Notes (Signed)
[facility] Emergency Department Provider Note  ____________________________________________  Time seen: Approximately 11:08 PM  I have reviewed the triage vital signs and the nursing notes.   HISTORY  Chief Complaint Foreign Body in Eye    HPI [name] Griffin is a 36 y.o. male who presents to emergency department complaining of foreign body to the left cornea. Patient reports that he was grinding a metal cable when he suffered a foreign body to the left eye yesterday. Patient presented to [facility] for evaluation. Provider there attempted removal with cotton swab and was unable to dislodge foreign body. Patient was sent to [facility] for ophthalmology. Patient reports that he presented to the emergency department, waited 7 hours, ophthalmology was patient multiple times and did not report to the emergency department. Patient reports the emergency department tonight with a continual complaint of foreign body to the left cornea. Patient reports that for about a similar clock position. Patient denies any visual changes, drainage from the left eye. Patient reports that he has a headache from foreign body sensation to the left eye. No other complaints at this time. Since patient was transferred to [facility], patient was not provided with antibiotic eyedrops or symptom control medications. Patient does not wear glasses or contacts.   Past Medical History:  Diagnosis Date  . Depression   . Renal disorder    kidney stones    There are no active problems to display for this patient.   History reviewed. No pertinent surgical history.  Prior to Admission medications   Medication Sig Start Date End Date Taking? Authorizing Provider  ciprofloxacin (CILOXAN) 0.3 % ophthalmic solution Place 1 drop into the left eye every 4 (four) hours while awake. 02/03/17 02/08/17  Charlane Westry, Delorise Royals, PA-C  clonazePAM (KLONOPIN) 0.5 MG tablet Take 1 tablet (0.5 mg total) by  mouth 2 (two) times daily as needed for anxiety. 10/07/16 10/17/16  Charlynne Pander, MD  ibuprofen (ADVIL,MOTRIN) 800 MG tablet Take 1 tablet (800 mg total) by mouth every 8 (eight) hours as needed. 01/26/17   Simcha Speir, Delorise Royals, PA-C  ketorolac (ACULAR) 0.5 % ophthalmic solution Place 1 drop into the left eye 4 (four) times daily. 02/03/17   Kabao Leite, Delorise Royals, PA-C  magic mouthwash w/lidocaine SOLN Take 5 mLs by mouth 4 (four) times daily. 01/26/17   Itzy Adler, Delorise Royals, PA-C  QUEtiapine (SEROQUEL XR) 50 MG TB24 24 hr tablet Take 1 tablet (50 mg total) by mouth daily. 10/07/16   Charlynne Pander, MD    Allergies Sulfa antibiotics  No family history on file.  Social History Social History  Substance Use Topics  . Smoking status: Current Every Day Smoker    Packs/day: 1.00  . Smokeless tobacco: Current User    Types: Chew  . Alcohol use No     Review of Systems  Constitutional: No fever/chills Eyes: No visual changes. No discharge. Positive for foreign body to the left eye ENT: No upper respiratory complaints. Cardiovascular: no chest pain. Respiratory: no cough. No SOB. Musculoskeletal: Negative for musculoskeletal pain. Skin: Negative for rash, abrasions, lacerations, ecchymosis. Neurological: Negative for headaches, focal weakness or numbness. 10-point ROS otherwise negative.  ____________________________________________   PHYSICAL EXAM:  VITAL SIGNS: ED Triage Vitals  Enc Vitals Group     BP 02/03/17 2144 126/82     Pulse Rate 02/03/17 2144 81     Resp 02/03/17 2144 18     Temp 02/03/17 2144 97.6 F (36.4 C)  Temp Source 02/03/17 2144 Oral     SpO2 02/03/17 2144 97 %     Weight 02/03/17 2145 135 lb (61.2 kg)     Height 02/03/17 2145 5\' 9"  (1.753 m)     Head Circumference --      Peak Flow --      Pain Score 02/03/17 2144 6     Pain Loc --      Pain Edu? --      Excl. in GC? --      Constitutional: Alert and oriented. Well appearing and in no  acute distress. Eyes: Conjunctivae are normal. PERRL. EOMI.Funduscopic exam reveals good red reflex, vasculature, optic disc. Foreign body is noted in the cornea at the 1:00 position overlying the iris. No rust ring identified. Eyes anesthetized using tetracaine drops and fluorescein staining is applied. Small area of uptake around foreign body. While eyes anesthetized, foreign body removal is attempted and is successful. See note below. Reexamination of the eye status post removal reveals no rust ring. No residual foreign body. Head: Atraumatic. ENT:      Ears:       Nose: No congestion/rhinnorhea.      Mouth/Throat: Mucous membranes are moist.  Neck: No stridor.    Cardiovascular: Normal rate, regular rhythm. Normal S1 and S2.  Good peripheral circulation. Respiratory: Normal respiratory effort without tachypnea or retractions. Lungs CTAB. Good air entry to the bases with no decreased or absent breath sounds.  Musculoskeletal: Full range of motion to all extremities. No gross deformities appreciated. Neurologic:  Normal speech and language. No gross focal neurologic deficits are appreciated.  Skin:  Skin is warm, dry and intact. No rash noted. Psychiatric: Mood and affect are normal. Speech and behavior are normal. Patient exhibits appropriate insight and judgement.   ____________________________________________   LABS (all labs ordered are listed, but only abnormal results are displayed)  Labs Reviewed - No data to display ____________________________________________  EKG   ____________________________________________  RADIOLOGY   No results found.  ____________________________________________    PROCEDURES  Procedure(s) performed:    .Foreign Body Removal Date/Time: 02/03/2017 11:58 PM Performed by: Gala Romney D Authorized by: Gala Romney D  Consent: Verbal consent obtained. Consent given by: patient Patient understanding: patient states  understanding of the procedure being performed Patient identity confirmed: verbally with patient Time out: Immediately prior to procedure a "time out" was called to verify the correct patient, procedure, equipment, support staff and site/side marked as required. Body area: eye Location details: left cornea  Anesthesia: Local Anesthetic: tetracaine drops  Sedation: Patient sedated: no Patient restrained: no Patient cooperative: yes Localization method: magnification and visualized Removal mechanism: 27-gauge needle and moist cotton swab Eye examined with fluorescein. Fluorescein uptake. Corneal abrasion size: small Corneal abrasion location: superior No residual rust ring present. Dressing: antibiotic drops Depth: embedded Complexity: simple 1 objects recovered. Objects recovered: metal shaving Post-procedure assessment: foreign body removed Patient tolerance: Patient tolerated the procedure well with no immediate complications Comments: Patient's eye is anesthetized using tetracaine drops. Foreign body is visualized with a small surrounding area of uptake on fluorescein staining. Using a 27-gauge needle, foreign body is extracted. There is no residual rust ring. Patient will be treated with antibiotic eyedrops as well as Acular.      Medications  fluorescein ophthalmic strip 1 strip (1 strip Left Eye Given 02/03/17 2313)  tetracaine (PONTOCAINE) 0.5 % ophthalmic solution 2 drop (2 drops Left Eye Given 02/03/17 2314)  butalbital-acetaminophen-caffeine (FIORICET, ESGIC) 50-325-40 MG per tablet  1 tablet (1 tablet Oral Given 02/04/17 0000)  diazepam (VALIUM) tablet 5 mg (5 mg Oral Given 02/04/17 0000)     ____________________________________________   INITIAL IMPRESSION / ASSESSMENT AND PLAN / ED COURSE  Pertinent labs & imaging results that were available during my care of the patient were reviewed by me and considered in my medical decision making (see chart for  details).  Review of the [place] CSRS was performed in accordance of the [facility] prior to dispensing any controlled drugs.     Patient's diagnosis is consistent with foreign body to left eye. Metal shaving is appreciated to the left cornea. This was successfully removed to the emergency department. No residual rust ring. Patient will be placed on antibiotic eyedrops and Acular. Patient will follow-up with ophthalmology as needed.. Patient is given ED precautions to return to the ED for any worsening or new symptoms.     ____________________________________________  FINAL CLINICAL IMPRESSION(S) / ED DIAGNOSES  Final diagnoses:  Foreign body of left eye, initial encounter      NEW MEDICATIONS STARTED DURING THIS VISIT:  New Prescriptions   CIPROFLOXACIN (CILOXAN) 0.3 % OPHTHALMIC SOLUTION    Place 1 drop into the left eye every 4 (four) hours while awake.   KETOROLAC (ACULAR) 0.5 % OPHTHALMIC SOLUTION    Place 1 drop into the left eye 4 (four) times daily.        This chart was dictated using voice recognition software/Dragon. Despite best efforts to proofread, errors can occur which can change the meaning. Any change was purely unintentional.    Racheal Patches, PA-C 02/04/17 0003    Willy Eddy, MD 02/10/17 [phone]

## 2017-02-03 NOTE — ED Notes (Signed)
Pt is 20/25 in both eyes

## 2017-02-03 NOTE — ED Triage Notes (Signed)
Patient states that he has a piece of metal in his eye times 2 days. Patient states that he was seen at the [facility] ED and they were unable to remove it.

## 2017-02-17 DIAGNOSIS — S2232XD Fracture of one rib, left side, subsequent encounter for fracture with routine healing: Secondary | ICD-10-CM | POA: Insufficient documentation

## 2017-02-17 DIAGNOSIS — Z720 Tobacco use: Secondary | ICD-10-CM | POA: Insufficient documentation

## 2017-02-17 DIAGNOSIS — F418 Other specified anxiety disorders: Secondary | ICD-10-CM | POA: Insufficient documentation

## 2017-08-30 ENCOUNTER — Other Ambulatory Visit: Payer: Self-pay | Admitting: Family Medicine

## 2017-08-30 DIAGNOSIS — R51 Headache: Principal | ICD-10-CM

## 2017-08-30 DIAGNOSIS — R519 Headache, unspecified: Secondary | ICD-10-CM

## 2017-09-02 ENCOUNTER — Ambulatory Visit: Admission: RE | Admit: 2017-09-02 | Payer: Medicaid Other | Source: Ambulatory Visit

## 2017-09-05 ENCOUNTER — Other Ambulatory Visit: Payer: Self-pay | Admitting: Neurology

## 2017-09-05 DIAGNOSIS — R519 Headache, unspecified: Secondary | ICD-10-CM

## 2017-09-05 DIAGNOSIS — R51 Headache: Principal | ICD-10-CM

## 2017-09-10 ENCOUNTER — Ambulatory Visit: Payer: Medicaid Other

## 2018-03-03 ENCOUNTER — Other Ambulatory Visit: Payer: Self-pay | Admitting: Orthopedic Surgery

## 2018-03-03 DIAGNOSIS — M25512 Pain in left shoulder: Secondary | ICD-10-CM

## 2018-03-08 ENCOUNTER — Ambulatory Visit: Payer: Medicaid Other

## 2018-03-15 ENCOUNTER — Ambulatory Visit: Payer: Medicaid Other

## 2018-03-15 ENCOUNTER — Ambulatory Visit
Admission: EM | Admit: 2018-03-15 | Discharge: 2018-03-15 | Disposition: A | Discharge status: Home / Self Care | Payer: Medicaid Other | Attending: Family Medicine | Admitting: Family Medicine

## 2018-03-15 ENCOUNTER — Other Ambulatory Visit: Payer: Self-pay

## 2018-03-15 DIAGNOSIS — M25531 Pain in right wrist: Secondary | ICD-10-CM | POA: Insufficient documentation

## 2018-03-15 DIAGNOSIS — S53401A Unspecified sprain of right elbow, initial encounter: Secondary | ICD-10-CM

## 2018-03-15 DIAGNOSIS — R609 Edema, unspecified: Secondary | ICD-10-CM | POA: Insufficient documentation

## 2018-03-15 DIAGNOSIS — S63501A Unspecified sprain of right wrist, initial encounter: Secondary | ICD-10-CM

## 2018-03-15 MED ORDER — IBUPROFEN 800 MG PO TABS: 800.0000 mg | ORAL_TABLET | Freq: Three times a day (TID) | ORAL | 0 refills | Status: DC | PRN

## 2018-03-15 NOTE — Discharge Instructions (Signed)
Rest, ice , ibuprofen.

## 2018-03-15 NOTE — ED Triage Notes (Signed)
Patient complains of right arm pain that started last night after jumping up on a counter. Patient states that pain shot from wrist to elbow. Patient states that he has noticed a knot on his wrist today.

## 2018-03-17 ENCOUNTER — Ambulatory Visit (HOSPITAL_COMMUNITY): Admission: RE | Admit: 2018-03-17 | Payer: Self-pay | Source: Ambulatory Visit

## 2018-03-17 ENCOUNTER — Ambulatory Visit (HOSPITAL_COMMUNITY): Admission: RE | Admit: 2018-03-17 | Payer: Medicaid Other | Source: Ambulatory Visit

## 2018-06-10 ENCOUNTER — Emergency Department
Admission: EM | Admit: 2018-06-10 | Discharge: 2018-06-11 | Discharge status: Against Medical Advice | Payer: Medicaid Other

## 2018-08-13 ENCOUNTER — Other Ambulatory Visit: Payer: Self-pay

## 2018-08-13 ENCOUNTER — Encounter: Payer: Self-pay | Admitting: Emergency Medicine

## 2018-08-13 ENCOUNTER — Emergency Department
Admission: EM | Admit: 2018-08-13 | Discharge: 2018-08-13 | Disposition: A | Discharge status: Home / Self Care | Payer: Medicaid Other | Attending: Emergency Medicine | Admitting: Emergency Medicine

## 2018-08-13 ENCOUNTER — Emergency Department: Payer: Medicaid Other

## 2018-08-13 DIAGNOSIS — S61210A Laceration without foreign body of right index finger without damage to nail, initial encounter: Secondary | ICD-10-CM | POA: Insufficient documentation

## 2018-08-13 DIAGNOSIS — S60413A Abrasion of left middle finger, initial encounter: Secondary | ICD-10-CM | POA: Insufficient documentation

## 2018-08-13 DIAGNOSIS — F17210 Nicotine dependence, cigarettes, uncomplicated: Secondary | ICD-10-CM | POA: Diagnosis not present

## 2018-08-13 DIAGNOSIS — T148XXA Other injury of unspecified body region, initial encounter: Secondary | ICD-10-CM

## 2018-08-13 DIAGNOSIS — Y9389 Activity, other specified: Secondary | ICD-10-CM | POA: Insufficient documentation

## 2018-08-13 DIAGNOSIS — Y9289 Other specified places as the place of occurrence of the external cause: Secondary | ICD-10-CM | POA: Insufficient documentation

## 2018-08-13 DIAGNOSIS — Z79899 Other long term (current) drug therapy: Secondary | ICD-10-CM | POA: Insufficient documentation

## 2018-08-13 DIAGNOSIS — W228XXA Striking against or struck by other objects, initial encounter: Secondary | ICD-10-CM | POA: Insufficient documentation

## 2018-08-13 DIAGNOSIS — Y998 Other external cause status: Secondary | ICD-10-CM | POA: Insufficient documentation

## 2018-08-13 MED ORDER — CEPHALEXIN 500 MG PO CAPS: 500.0000 mg | ORAL_CAPSULE | Freq: Three times a day (TID) | ORAL | 0 refills | Status: DC

## 2018-08-13 MED ORDER — LIDOCAINE HCL (PF) 1 % IJ SOLN
5.0000 mL | Freq: Once | INTRAMUSCULAR | Status: DC
  Filled 2018-08-13: qty 5

## 2018-08-13 NOTE — ED Notes (Signed)
Pt has laceration noted to right pointer finger and middle finger. No bleeding at this time

## 2018-08-13 NOTE — Discharge Instructions (Addendum)
Follow-up with orthopedics if not better in 3 to 5 days.  Return emergency department worsening.  The suture should remain intact for 7 to 10 days.  Take the medication as prescribed.

## 2018-08-13 NOTE — ED Provider Notes (Signed)
[facility] Emergency Department Provider Note  ____________________________________________   First MD Initiated Contact with Patient 08/13/18 1016     (approximate)  I have reviewed the triage vital signs and the nursing notes.   HISTORY  Chief Complaint Hand Injury    HPI [name] Griffin is a 38 y.o. male presents emergency department complaint of a laceration to the right index finger and left middle finger.  He was grinding and the piece of metal broke.   He states his tetanus is up-to-date.   Past Medical History:  Diagnosis Date  . Depression   . Renal disorder    kidney stones    There are no active problems to display for this patient.   Past Surgical History:  Procedure Laterality Date  . NO PAST SURGERIES      Prior to Admission medications   Medication Sig Start Date End Date Taking? Authorizing Provider  cephALEXin (KEFLEX) 500 MG capsule Take 1 capsule (500 mg total) by mouth 3 (three) times daily. 08/13/18   Onie Kasparek, Roselyn Bering, PA-C  clonazePAM (KLONOPIN) 0.5 MG tablet Take 1 tablet (0.5 mg total) by mouth 2 (two) times daily as needed for anxiety. 10/07/16 10/17/16  Charlynne Pander, MD  propranolol ER (INDERAL LA) 60 MG 24 hr capsule Take by mouth. 09/02/17 09/02/18  [provider]  QUEtiapine (SEROQUEL XR) 50 MG TB24 24 hr tablet Take 1 tablet (50 mg total) by mouth daily. 10/07/16   Charlynne Pander, MD    Allergies Sulfa antibiotics  Family History  Problem Relation Age of Onset  . Aneurysm Mother   . Hypertension Mother   . Diabetes Mother   . Hypertension Father   . Diabetes Father   . Aneurysm Father   . COPD Father   . Heart failure Father     Social History Social History   Tobacco Use  . Smoking status: Current Every Day Smoker    Packs/day: 1.00  . Smokeless tobacco: Former Neurosurgeon    Types: Chew  Substance Use Topics  . Alcohol use: No  . Drug use: Yes    Types: Marijuana    Review of  Systems  Constitutional: No fever/chills Eyes: No visual changes. ENT: No sore throat. Respiratory: Denies cough Genitourinary: Negative for dysuria. Musculoskeletal: Negative for back pain.  Positive laceration to the right index finger and left middle finger Skin: Negative for rash.    ____________________________________________   PHYSICAL EXAM:  VITAL SIGNS: ED Triage Vitals  Enc Vitals Group     BP 08/13/18 0936 (!) 133/94     Pulse Rate 08/13/18 0936 83     Resp 08/13/18 0936 18     Temp 08/13/18 0936 98.2 F (36.8 C)     Temp Source 08/13/18 0936 Oral     SpO2 08/13/18 0936 98 %     Weight 08/13/18 0934 135 lb (61.2 kg)     Height 08/13/18 0934 5\' 9"  (1.753 m)     Head Circumference --      Peak Flow --      Pain Score 08/13/18 0934 9     Pain Loc --      Pain Edu? --      Excl. in GC? --     Constitutional: Alert and oriented. Well appearing and in no acute distress. Eyes: Conjunctivae are normal.  Head: Atraumatic. Nose: No congestion/rhinnorhea. Mouth/Throat: Mucous membranes are moist.   Neck:  supple no lymphadenopathy noted Cardiovascular: Normal rate,  regular rhythm. Respiratory: Normal respiratory effort.  No retractions,   GU: deferred Musculoskeletal: FROM all extremities, warm and well perfused, positive for 1.5 cm laceration along the distal end of the right index finger, abrasion noted at the left middle finger.  Left middle finger is tender to palpation, metal dust is noted on the patient's hands. Neurologic:  Normal speech and language.  Skin:  Skin is warm, dry ,. No rash noted. Psychiatric: Mood and affect are normal. Speech and behavior are normal.  ____________________________________________   LABS (all labs ordered are listed, but only abnormal results are displayed)  Labs Reviewed - No data to display ____________________________________________   ____________________________________________  RADIOLOGY  X-ray of the right  hand is negative for fracture, radiologist is noticing a questionable foreign body, on physical exam no foreign body is actually noted. X-ray of the left hand is now  ____________________________________________   PROCEDURES  Procedure(s) performed:   Marland KitchenMarland KitchenLaceration Repair Date/Time: 08/13/2018 11:34 AM Performed by: Faythe Ghee, PA-C Authorized by: Faythe Ghee, PA-C   Consent:    Consent obtained:  Verbal   Consent given by:  Patient   Risks discussed:  Infection, pain, retained foreign body, tendon damage, poor wound healing and nerve damage   Alternatives discussed:  Referral Anesthesia (see MAR for exact dosages):    Anesthesia method:  Nerve block   Block needle gauge:  25 G   Block anesthetic:  Lidocaine 1% w/o epi   Block injection procedure:  Anatomic landmarks identified, introduced needle, incremental injection, negative aspiration for blood and anatomic landmarks palpated   Block outcome:  Anesthesia achieved Laceration details:    Location:  Finger   Finger location:  R index finger   Length (cm):  1.5   Depth (mm):  2 Repair type:    Repair type:  Simple Pre-procedure details:    Preparation:  Patient was prepped and draped in usual sterile fashion Exploration:    Hemostasis achieved with:  Direct pressure   Wound exploration: wound explored through full range of motion     Wound extent: no foreign bodies/material noted, no muscle damage noted, no nerve damage noted, no tendon damage noted and no underlying fracture noted     Contaminated: no   Treatment:    Area cleansed with:  Betadine and saline   Amount of cleaning:  Extensive   Irrigation solution:  Sterile saline   Irrigation method:  Pressure wash, syringe and tap   Visualized foreign bodies/material removed: no   Skin repair:    Repair method:  Sutures   Suture size:  5-0   Suture material:  Nylon   Suture technique:  Simple interrupted   Number of sutures:  7 Approximation:     Approximation:  Close Post-procedure details:    Dressing:  Non-adherent dressing   Patient tolerance of procedure:  Tolerated well, no immediate complications      ____________________________________________   INITIAL IMPRESSION / ASSESSMENT AND PLAN / ED COURSE  Pertinent labs & imaging results that were available during my care of the patient were reviewed by me and considered in my medical decision making (see chart for details).   Patient is 38 year old male presents emergency department with lacerations to the right index finger and left middle finger.  Tdap is up-to-date.  Physical exam shows lacerations.  No foreign body or tendon involvement is noted.  However the radiologist is noting a foreign body.  I think this is mainly due to the amount of  metal that is on the patient's hand.  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  See procedure note for repair.  Patient was given a prescription for Keflex 500 3 times daily.  He is to follow-up with orthopedics.  He was given a work note.  He was discharged in stable condition.     As part of my medical decision making, I reviewed the following data within the electronic MEDICAL RECORD NUMBER Nursing notes reviewed and incorporated, Old chart reviewed, Radiograph reviewed x-rays as noted above, Notes from prior ED visits and  Controlled Substance Database  ____________________________________________   FINAL CLINICAL IMPRESSION(S) / ED DIAGNOSES  Final diagnoses:  Laceration of right index finger without foreign body without damage to nail, initial encounter  Abrasion      NEW MEDICATIONS STARTED DURING THIS VISIT:  New Prescriptions   CEPHALEXIN (KEFLEX) 500 MG CAPSULE    Take 1 capsule (500 mg total) by mouth 3 (three) times daily.     Note:  This document was prepared using Dragon voice recognition software and may include unintentional dictation errors.    Faythe Ghee, PA-C 08/13/18  1137    Jene Every, MD 08/13/18 [phone]

## 2018-08-13 NOTE — ED Triage Notes (Signed)
Pt had mechanical injury causing laceration to pt's right pointer finger and left middle finger. Bleeding contained.

## 2018-11-07 DIAGNOSIS — S9031XA Contusion of right foot, initial encounter: Secondary | ICD-10-CM | POA: Insufficient documentation

## 2018-11-12 ENCOUNTER — Emergency Department
Admission: EM | Admit: 2018-11-12 | Discharge: 2018-11-12 | Disposition: A | Discharge status: Home / Self Care | Payer: Medicaid Other | Attending: Emergency Medicine | Admitting: Emergency Medicine

## 2018-11-12 ENCOUNTER — Encounter: Payer: Self-pay | Admitting: Emergency Medicine

## 2018-11-12 ENCOUNTER — Other Ambulatory Visit: Payer: Self-pay

## 2018-11-12 DIAGNOSIS — R109 Unspecified abdominal pain: Secondary | ICD-10-CM | POA: Diagnosis not present

## 2018-11-12 DIAGNOSIS — Z5321 Procedure and treatment not carried out due to patient leaving prior to being seen by health care provider: Secondary | ICD-10-CM | POA: Insufficient documentation

## 2018-11-12 LAB — LIPASE, BLOOD: Lipase: 20 U/L (ref 11–51)

## 2018-11-12 LAB — CBC
HCT: 43.8 % (ref 39.0–52.0)
Hemoglobin: 15.4 g/dL (ref 13.0–17.0)
MCH: 32.7 pg (ref 26.0–34.0)
MCHC: 35.2 g/dL (ref 30.0–36.0)
MCV: 93 fL (ref 80.0–100.0)
Platelets: 156 10*3/uL (ref 150–400)
RBC: 4.71 MIL/uL (ref 4.22–5.81)
RDW: 12.5 % (ref 11.5–15.5)
WBC: 11.8 10*3/uL — ABNORMAL HIGH (ref 4.0–10.5)
nRBC: 0 % (ref 0.0–0.2)

## 2018-11-12 LAB — COMPREHENSIVE METABOLIC PANEL
ALT: 11 U/L (ref 0–44)
AST: 18 U/L (ref 15–41)
Albumin: 5 g/dL (ref 3.5–5.0)
Alkaline Phosphatase: 64 U/L (ref 38–126)
Anion gap: 11 (ref 5–15)
BUN: 18 mg/dL (ref 6–20)
CO2: 24 mmol/L (ref 22–32)
Calcium: 9.3 mg/dL (ref 8.9–10.3)
Chloride: 104 mmol/L (ref 98–111)
Creatinine, Ser: 0.85 mg/dL (ref 0.61–1.24)
GFR calc Af Amer: >60 mL/min (ref >60)
GFR calc non Af Amer: >60 mL/min (ref >60)
Glucose, Bld: 111 mg/dL — ABNORMAL HIGH (ref 70–99)
Potassium: 3.6 mmol/L (ref 3.5–5.1)
Sodium: 139 mmol/L (ref 135–145)
Total Bilirubin: 1 mg/dL (ref 0.3–1.2)
Total Protein: 7.6 g/dL (ref 6.5–8.1)

## 2018-11-12 NOTE — ED Notes (Signed)
Pt name called x3 by Florentina Addison, NT with no answer.

## 2018-11-12 NOTE — ED Notes (Signed)
Attempted to call patient on cell phone.  No answer.

## 2018-11-12 NOTE — ED Triage Notes (Signed)
Patient presents to the ED with bilateral flank pain, vomiting and shortness of breath that began today.  Patient denies cough and congestion.  Patient is unsure if he is febrile.

## 2018-11-13 ENCOUNTER — Telehealth: Payer: Self-pay | Admitting: Emergency Medicine

## 2018-11-13 NOTE — Telephone Encounter (Signed)
Called patient due to lwot to inquire about condition and follow up plans. Says he continues to have pains in his heart and high blood pressure.  Says he has had the chest pains for months, but the shortness of breath is new as of yesterday. Also says he gets into sweats at times.  I told him that he really needs to be seen. He says he needs to work to feed his children.  He has a pcp at [facility], and he agrees to call them now.  I told him that his symptoms are also concerning for corona and he needs to call the pcp now and see if they want him to go to one of the testing sites.

## 2018-11-23 ENCOUNTER — Ambulatory Visit
Admission: EM | Admit: 2018-11-23 | Discharge: 2018-11-23 | Disposition: A | Discharge status: Home / Self Care | Payer: Medicaid Other | Attending: Family Medicine | Admitting: Family Medicine

## 2018-11-23 ENCOUNTER — Other Ambulatory Visit: Payer: Self-pay

## 2018-11-23 DIAGNOSIS — R300 Dysuria: Secondary | ICD-10-CM | POA: Diagnosis not present

## 2018-11-23 DIAGNOSIS — R5383 Other fatigue: Secondary | ICD-10-CM

## 2018-11-23 DIAGNOSIS — M545 Low back pain: Secondary | ICD-10-CM | POA: Diagnosis not present

## 2018-11-23 LAB — URINALYSIS, COMPLETE (UACMP) WITH MICROSCOPIC
Bilirubin Urine: NEGATIVE
Glucose, UA: NEGATIVE mg/dL
Ketones, ur: NEGATIVE mg/dL
Nitrite: NEGATIVE
Protein, ur: 100 mg/dL — AB
Specific Gravity, Urine: 1.025 (ref 1.005–1.030)
Squamous Epithelial / HPF: NONE SEEN (ref 0–5)
pH: 7 (ref 5.0–8.0)

## 2018-11-23 LAB — CHLAMYDIA/NGC RT PCR (ARMC ONLY)
Chlamydia Tr: DETECTED — AB
N gonorrhoeae: NOT DETECTED

## 2018-11-23 MED ORDER — CEFTRIAXONE SODIUM 250 MG IJ SOLR
250.0000 mg | Freq: Once | INTRAMUSCULAR | Status: AC
  Administered 2018-11-23: 250 mg via INTRAMUSCULAR

## 2018-11-23 MED ORDER — AZITHROMYCIN 500 MG PO TABS
1000.0000 mg | ORAL_TABLET | Freq: Once | ORAL | Status: AC
  Administered 2018-11-23: 1000 mg via ORAL

## 2018-11-23 NOTE — ED Triage Notes (Signed)
Patient complains of painful urination. Patient states that he thinks he has gonorrhea because this is how he has felt in the past. Reports that he has pain in his kidneys as well.  Patient states that he has been taking propranolol and thinks this is making him not be able to sleep at night.

## 2018-11-23 NOTE — Discharge Instructions (Signed)
Stop the propranolol.   We will call with your results.  Take care  Dr. Lacinda Axon

## 2018-11-23 NOTE — ED Provider Notes (Signed)
[name]    CSN: [id number] Arrival date & time: 11/23/18  1039  History   Chief Complaint Chief Complaint  Patient presents with  . Dysuria   HPI  38 year old male presents with dysuria.  Patient reports that for the past few days he has had painful urination.  Patient states that he has had gonorrhea in the past which has presented similarly.  He is sexually active and is having unprotected intercourse.  Denies penile discharge.  He does report back pain.  No fever.  No other reported urinary symptoms.  Additionally, patient reports that he is having insomnia.  He believes that it is secondary to his medication (propranolol).  He states he is also having significant fatigue.  He would like to discuss this today.  No other associated symptoms.  No other complaints.  History reviewed and updated as below.  Past Medical History:  Diagnosis Date  . Depression   . Renal disorder    kidney stones  Hx of gonorrhea Anxiety  Recurrent shoulder dislocation  Home Medications    Prior to Admission medications   Medication Sig Start Date End Date Taking? Authorizing Provider  omeprazole (PRILOSEC) 40 MG capsule Take by mouth. 11/15/18 11/15/19 Yes [provider]  propranolol ER (INDERAL LA) 60 MG 24 hr capsule Take by mouth. 09/02/17 11/23/18 Yes [provider]  clonazePAM (KLONOPIN) 0.5 MG tablet Take 1 tablet (0.5 mg total) by mouth 2 (two) times daily as needed for anxiety. 10/07/16 11/23/18  Charlynne PanderYao, David Hsienta, MD  QUEtiapine (SEROQUEL XR) 50 MG TB24 24 hr tablet Take 1 tablet (50 mg total) by mouth daily. 10/07/16 11/23/18  Charlynne PanderYao, David Hsienta, MD    Family History Family History  Problem Relation Age of Onset  . Aneurysm Mother   . Hypertension Mother   . Diabetes Mother   . Hypertension Father   . Diabetes Father   . Aneurysm Father   . COPD Father   . Heart failure Father     Social History Social History   Tobacco Use  . Smoking status:  Current Every Day Smoker    Packs/day: 1.00  . Smokeless tobacco: Former NeurosurgeonUser    Types: Chew  Substance Use Topics  . Alcohol use: No  . Drug use: Yes    Types: Marijuana    Allergies   Sulfa antibiotics  Review of Systems Review of Systems  Constitutional: Positive for fatigue.  Genitourinary: Positive for dysuria.  Musculoskeletal: Positive for back pain.  Psychiatric/Behavioral: Positive for sleep disturbance.   Physical Exam Triage Vital Signs ED Triage Vitals  Enc Vitals Group     BP 11/23/18 1054 131/89     Pulse Rate 11/23/18 1054 66     Resp 11/23/18 1054 16     Temp 11/23/18 1054 98.3 F (36.8 C)     Temp Source 11/23/18 1054 Oral     SpO2 11/23/18 1054 99 %     Weight 11/23/18 1052 125 lb (56.7 kg)     Height 11/23/18 1052 5\' 10"  (1.778 m)     Head Circumference --      Peak Flow --      Pain Score 11/23/18 1052 7     Pain Loc --      Pain Edu? --      Excl. in GC? --    Updated Vital Signs BP 131/89 (BP Location: Left Arm)   Pulse 66   Temp 98.3 F (36.8 C) (Oral)  Resp 16   Ht 5\' 10"  (1.778 m)   Wt 56.7 kg   SpO2 99%   BMI 17.94 kg/m   Visual Acuity Right Eye Distance:   Left Eye Distance:   Bilateral Distance:    Right Eye Near:   Left Eye Near:    Bilateral Near:     Physical Exam Vitals signs and nursing note reviewed.  Constitutional:      General: He is not in acute distress.    Appearance: Normal appearance.  HENT:     Head: Normocephalic and atraumatic.  Eyes:     General:        Right eye: No discharge.        Left eye: No discharge.     Conjunctiva/sclera: Conjunctivae normal.  Pulmonary:     Effort: Pulmonary effort is normal. No respiratory distress.  Abdominal:     General: There is no distension.     Palpations: Abdomen is soft.     Tenderness: There is no abdominal tenderness.  Genitourinary:    Penis: Normal.      Scrotum/Testes: Normal.  Neurological:     Mental Status: He is alert.  Psychiatric:         Mood and Affect: Mood normal.        Behavior: Behavior normal.    UC Treatments / Results  Labs (all labs ordered are listed, but only abnormal results are displayed) Labs Reviewed  CHLAMYDIA/NGC RT PCR ([facility] ONLY)  URINALYSIS, COMPLETE (UACMP) WITH MICROSCOPIC    EKG None  Radiology No results found.  Procedures Procedures (including critical care time)  Medications Ordered in UC Medications  azithromycin (ZITHROMAX) tablet 1,000 mg (1,000 mg Oral Given 11/23/18 1118)  cefTRIAXone (ROCEPHIN) injection 250 mg (250 mg Intramuscular Given 11/23/18 1118)    Initial Impression / Assessment and Plan / UC Course  I have reviewed the triage vital signs and the nursing notes.  Pertinent labs & imaging results that were available during my care of the patient were reviewed by me and considered in my medical decision making (see chart for details).    38 year old male presents with dysuria.  Also having side effects from propanolol.  Awaiting STD results.  Patient will need empiric treatment.  Rocephin and azithromycin today.  Advised to stop propranolol.  Final Clinical Impressions(s) / UC Diagnoses   Final diagnoses:  Dysuria     Discharge Instructions     Stop the propranolol.   We will call with your results.  Take care  Dr. Lacinda Axon     ED Prescriptions    None     Controlled Substance Prescriptions [place] Controlled Substance Registry consulted? Not Applicable   Coral Spikes, DO 11/23/18 1122

## 2018-11-27 ENCOUNTER — Telehealth (HOSPITAL_COMMUNITY): Payer: Self-pay | Admitting: Emergency Medicine

## 2018-11-27 NOTE — Telephone Encounter (Signed)
Chlamydia is positive.  This was treated at the urgent care visit with po zithromax 1g.  Pt needs education to please refrain from sexual intercourse for 7 days to give the medicine time to work.  Sexual partners need to be notified and tested/treated.  Condoms may reduce risk of reinfection.  Recheck or followup with PCP for further evaluation if symptoms are not improving.  [facility] notified.  Patient contacted and made aware of all results, all questions answered.   

## 2019-03-08 ENCOUNTER — Emergency Department: Payer: Medicaid Other

## 2019-03-08 ENCOUNTER — Encounter: Payer: Self-pay | Admitting: Emergency Medicine

## 2019-03-08 ENCOUNTER — Emergency Department
Admission: EM | Admit: 2019-03-08 | Discharge: 2019-03-09 | Disposition: A | Discharge status: Home / Self Care | Payer: Medicaid Other | Attending: Emergency Medicine | Admitting: Emergency Medicine

## 2019-03-08 ENCOUNTER — Ambulatory Visit
Admission: EM | Admit: 2019-03-08 | Discharge: 2019-03-08 | Disposition: A | Discharge status: Other Acute Inpatient Hospital | Payer: Medicaid Other

## 2019-03-08 ENCOUNTER — Other Ambulatory Visit: Payer: Self-pay

## 2019-03-08 DIAGNOSIS — Z79899 Other long term (current) drug therapy: Secondary | ICD-10-CM | POA: Insufficient documentation

## 2019-03-08 DIAGNOSIS — R110 Nausea: Secondary | ICD-10-CM | POA: Diagnosis not present

## 2019-03-08 DIAGNOSIS — R630 Anorexia: Secondary | ICD-10-CM | POA: Insufficient documentation

## 2019-03-08 DIAGNOSIS — R109 Unspecified abdominal pain: Secondary | ICD-10-CM

## 2019-03-08 DIAGNOSIS — F17210 Nicotine dependence, cigarettes, uncomplicated: Secondary | ICD-10-CM | POA: Diagnosis not present

## 2019-03-08 DIAGNOSIS — R1013 Epigastric pain: Secondary | ICD-10-CM | POA: Diagnosis not present

## 2019-03-08 DIAGNOSIS — R1010 Upper abdominal pain, unspecified: Secondary | ICD-10-CM | POA: Diagnosis present

## 2019-03-08 LAB — COMPREHENSIVE METABOLIC PANEL
ALT: 16 U/L (ref 0–44)
AST: 25 U/L (ref 15–41)
Albumin: 4.4 g/dL (ref 3.5–5.0)
Alkaline Phosphatase: 78 U/L (ref 38–126)
Anion gap: 9 (ref 5–15)
BUN: 15 mg/dL (ref 6–20)
CO2: 24 mmol/L (ref 22–32)
Calcium: 9.2 mg/dL (ref 8.9–10.3)
Chloride: 104 mmol/L (ref 98–111)
Creatinine, Ser: 0.82 mg/dL (ref 0.61–1.24)
GFR calc Af Amer: >60 mL/min (ref >60)
GFR calc non Af Amer: >60 mL/min (ref >60)
Glucose, Bld: 113 mg/dL — ABNORMAL HIGH (ref 70–99)
Potassium: 3.7 mmol/L (ref 3.5–5.1)
Sodium: 137 mmol/L (ref 135–145)
Total Bilirubin: 0.7 mg/dL (ref 0.3–1.2)
Total Protein: 7.1 g/dL (ref 6.5–8.1)

## 2019-03-08 LAB — URINALYSIS, COMPLETE (UACMP) WITH MICROSCOPIC
Bacteria, UA: NONE SEEN
Bilirubin Urine: NEGATIVE
Glucose, UA: NEGATIVE mg/dL
Hgb urine dipstick: NEGATIVE
Ketones, ur: NEGATIVE mg/dL
Leukocytes,Ua: NEGATIVE
Nitrite: NEGATIVE
Protein, ur: NEGATIVE mg/dL
Specific Gravity, Urine: 1.012 (ref 1.005–1.030)
pH: 6 (ref 5.0–8.0)

## 2019-03-08 LAB — CBC
HCT: 41 % (ref 39.0–52.0)
Hemoglobin: 14 g/dL (ref 13.0–17.0)
MCH: 32 pg (ref 26.0–34.0)
MCHC: 34.1 g/dL (ref 30.0–36.0)
MCV: 93.6 fL (ref 80.0–100.0)
Platelets: 152 10*3/uL (ref 150–400)
RBC: 4.38 MIL/uL (ref 4.22–5.81)
RDW: 12.8 % (ref 11.5–15.5)
WBC: 8.4 10*3/uL (ref 4.0–10.5)
nRBC: 0 % (ref 0.0–0.2)

## 2019-03-08 LAB — LIPASE, BLOOD: Lipase: 21 U/L (ref 11–51)

## 2019-03-08 LAB — ACETAMINOPHEN LEVEL: Acetaminophen (Tylenol), Serum: <10 ug/mL — ABNORMAL LOW (ref 10–30)

## 2019-03-08 MED ORDER — IOHEXOL 300 MG/ML  SOLN
100.0000 mL | Freq: Once | INTRAMUSCULAR | Status: AC | PRN
  Administered 2019-03-08: 100 mL via INTRAVENOUS

## 2019-03-08 MED ORDER — ONDANSETRON HCL 4 MG/2ML IJ SOLN
4.0000 mg | Freq: Once | INTRAMUSCULAR | Status: AC
  Administered 2019-03-08: 4 mg via INTRAVENOUS
  Filled 2019-03-08: qty 2

## 2019-03-08 MED ORDER — ONDANSETRON 4 MG PO TBDP: 4.0000 mg | ORAL_TABLET | Freq: Four times a day (QID) | ORAL | 0 refills | Status: DC | PRN

## 2019-03-08 MED ORDER — MORPHINE SULFATE (PF) 4 MG/ML IV SOLN
4.0000 mg | Freq: Once | INTRAVENOUS | Status: AC
  Administered 2019-03-08: 4 mg via INTRAVENOUS
  Filled 2019-03-08: qty 1

## 2019-03-08 MED ORDER — ALUM & MAG HYDROXIDE-SIMETH 200-200-20 MG/5ML PO SUSP
15.0000 mL | Freq: Once | ORAL | Status: AC
  Administered 2019-03-08: 15 mL via ORAL
  Filled 2019-03-08: qty 30

## 2019-03-08 MED ORDER — IOHEXOL 9 MG/ML PO SOLN
500.0000 mL | ORAL | Status: AC
  Administered 2019-03-08: 500 mL via ORAL

## 2019-03-08 MED ORDER — SODIUM CHLORIDE 0.9 % IV BOLUS
1000.0000 mL | Freq: Once | INTRAVENOUS | Status: AC
  Administered 2019-03-08: 1000 mL via INTRAVENOUS

## 2019-03-08 NOTE — ED Triage Notes (Addendum)
Patient c/o severe abdominal pain x 3 days. He states his pain is all over his abdomen. Denies nausea, vomiting and diarrhea.  States he has been able to eat and drink normally. Last BM this afternoon.  Patient states he has been taking Tylenol 1500mg -2000mg  3-4 times daily for dental. Patient states he has taken that much for about 3 weeks and stopped about 1 week ago.

## 2019-03-08 NOTE — Discharge Instructions (Signed)
Go to the emergency room at [place] for further assessment and imaging.   Honor Loh, MSN, APRN, FNP-C, CEN Advanced Practice Provider [facility] 03/08/2019 5:58 PM

## 2019-03-08 NOTE — Consult Note (Signed)
Subjective:   CC: Abdominal pain  HPI:  [name] Griffin is a 38 y.o. male who was consulted by [facility] for issue above.  Symptoms were first noted 3 days ago. Pain is crampy, localized to the epigastric area, and worsening over the past 3 days.  Associated with no other issues such as constipation, diarrhea, nausea/vomiting. exacerbated by nothing specific.  Last bowel movement earlier this evening which was normal.  Patient has had never had pain like this before.  Due to the worsening nature, decided coming to the ED for evaluation.  He is recently under some psychosocial distress, and has lost 10 pounds over the past month due to a decrease appetite secondary to the stressors.  He has slowly been regaining his appetite now that things are starting to turn around.  Endorses marijuana use, last time used was around 2 days ago.  States that he uses only one dealer and there is no reason he has been given anything else than marijuana.  Is taking amoxicillin for recent dental procedure.  Has tolerated penicillins before.   Past Medical History:  has a past medical history of Depression and Renal disorder.  History of kidney stones.  Past Surgical History:  Past Surgical History:  Procedure Laterality Date  . NO PAST SURGERIES      Family History: family history includes Aneurysm in his father and mother; COPD in his father; Diabetes in his father and mother; Heart failure in his father; Hypertension in his father and mother.  Social History:  reports that he has been smoking cigarettes. He has been smoking about 0.25 packs per day. He has quit using smokeless tobacco.  His smokeless tobacco use included chew. He reports current drug use. Drug: Marijuana. He reports that he does not drink alcohol.  Current Medications:  amoxicillin-clavulanate (AUGMENTIN) 875-125 MG tablet Take 1 tablet by mouth 2 (two) times daily. [provider] Needs Review  omeprazole (PRILOSEC) 40 MG capsule Take by  mouth. [provider] Needs Review  propranolol ER (INDERAL LA) 60 MG 24 hr capsule Take by mouth. [provider] Needs Review     Allergies:  Allergies as of 03/08/2019 - Review Complete 03/08/2019  Allergen Reaction Noted  . Sulfa antibiotics Swelling 01/09/2015    ROS:  General: Denies weight loss, weight gain, fatigue, fevers, chills, and night sweats. Eyes: Denies blurry vision, double vision, eye pain, itchy eyes, and tearing. Ears: Denies hearing loss, earache, and ringing in ears. Nose: Denies sinus pain, congestion, infections, runny nose, and nosebleeds. Mouth/throat: Denies hoarseness, sore throat, bleeding gums, and difficulty swallowing. Heart: Denies chest pain, palpitations, racing heart, irregular heartbeat, leg pain or swelling, and decreased activity tolerance. Respiratory: Denies breathing difficulty, shortness of breath, wheezing, cough, and sputum. GI: Denies  heartburn, nausea, vomiting, constipation, diarrhea, and blood in stool. GU: Denies difficulty urinating, pain with urinating, urgency, frequency, blood in urine. Musculoskeletal: Denies joint stiffness, pain, swelling, muscle weakness. Skin: Denies rash, itching, mass, tumors, sores, and boils Neurologic: Denies headache, fainting, dizziness, seizures, numbness, and tingling. Psychiatric: Denies depression, anxiety, difficulty sleeping, and memory loss. Endocrine: Denies heat or cold intolerance, and increased thirst or urination. Blood/lymph: Denies easy bruising, easy bruising, and swollen glands     Objective:     BP (!) 148/89   Pulse 65   Temp 97.9 F (36.6 C) (Oral)   Resp 18   Ht 5\' 10"  (1.778 m)   Wt 59 kg   SpO2 99%   BMI 18.65 kg/m  Constitutional :  alert, cooperative, appears stated age and no distress  Lymphatics/Throat:  no asymmetry, masses, or scars  Respiratory:  clear to auscultation bilaterally  Cardiovascular:  regular rate and rhythm   Gastrointestinal: Soft, no guarding, minimal tenderness in the epigastric region.  Negative Murphy sign..   Musculoskeletal: Steady movement  Skin: Cool and moist, no surgical scars.  No hernia or rash.   Psychiatric: Normal affect, non-agitated, not confused       LABS:  CMP Latest Ref Rng & Units 03/08/2019 11/12/2018 10/07/2016  Glucose 70 - 99 mg/dL 113(H) 111(H) 123(H)  BUN 6 - 20 mg/dL 15 18 15   Creatinine 0.61 - 1.24 mg/dL 0.82 0.85 1.14  Sodium 135 - 145 mmol/L 137 139 138  Potassium 3.5 - 5.1 mmol/L 3.7 3.6 3.2(L)  Chloride 98 - 111 mmol/L 104 104 104  CO2 22 - 32 mmol/L 24 24 27   Calcium 8.9 - 10.3 mg/dL 9.2 9.3 9.0  Total Protein 6.5 - 8.1 g/dL 7.1 7.6 6.5  Total Bilirubin 0.3 - 1.2 mg/dL 0.7 1.0 0.7  Alkaline Phos 38 - 126 U/L 78 64 60  AST 15 - 41 U/L 25 18 21   ALT 0 - 44 U/L 16 11 11(L)   CBC Latest Ref Rng & Units 03/08/2019 11/12/2018 10/07/2016  WBC 4.0 - 10.5 K/uL 8.4 11.8(H) 7.3  Hemoglobin 13.0 - 17.0 g/dL 14.0 15.4 14.1  Hematocrit 39.0 - 52.0 % 41.0 43.8 41.7  Platelets 150 - 400 K/uL 152 156 137(L)    RADS: CLINICAL DATA:  Generalized abdominal pain  EXAM: CT ABDOMEN AND PELVIS WITH CONTRAST  TECHNIQUE: Multidetector CT imaging of the abdomen and pelvis was performed using the standard protocol following bolus administration of intravenous contrast.  CONTRAST:  158mL OMNIPAQUE IOHEXOL 300 MG/ML  SOLN  COMPARISON:  04/09/2013  FINDINGS: Lower chest: Lung bases are clear. No effusions. Heart is normal size.  Hepatobiliary: No focal hepatic abnormality. Gallbladder unremarkable.  Pancreas: No focal abnormality or ductal dilatation.  Spleen: No focal abnormality.  Normal size.  Adrenals/Urinary Tract: Small cortical cysts in the left kidney. No stones or hydronephrosis. Adrenal glands and urinary bladder unremarkable.  Stomach/Bowel: Stomach, large and small bowel grossly unremarkable. Normal appendix.  Vascular/Lymphatic: No  evidence of aneurysm or adenopathy.  Reproductive: No visible focal abnormality.  Other: Trace free fluid in the pelvis.  No free air.  Musculoskeletal: No acute bony abnormality.  IMPRESSION: Normal appendix.  Trace free fluid in the pelvis.  Exact cause not identified.   Electronically Signed   By: Rolm Baptise M.D.   On: 03/08/2019 22:51 Assessment:   Abdominal pain CT images personally reviewed by myself and agree with radiology report  Plan:   No obvious cause for the trace free fluid that was noted within the CT scan.  Due to the subacute nature of the complaint, along with normal labs and vitals, doubt there is any surgical pathology that needs to be addressed at this time.  Only additional test I can think of that may benefit at this time will be further work-up for his gallbladder, due to the location of the pain.  I offered additional imaging studies but the patient is adamant that he he needs to leave the hospital and take care of his child.  He stated that he is comfortable going home at this time.  Recommend further follow-up on an outpatient basis with his primary care physician.  Our office will be more than happy to see him  on an elective basis if needed.

## 2019-03-08 NOTE — ED Notes (Signed)
Warm blanket provided upon request.

## 2019-03-08 NOTE — ED Triage Notes (Signed)
Pt in via POV, reports generalized abdominal pain x 3 days, denies N/V/D.  NAD noted at this time.

## 2019-03-08 NOTE — Discharge Instructions (Signed)

## 2019-03-08 NOTE — ED Provider Notes (Signed)
[name] Griffin   Name: [name] Griffin DOB: September 26, 1980 MRN: [id number] CSN: [id number] PCP: [place], [facility]  Arrival date and time:  03/08/19 1729  Chief Complaint:  Abdominal Pain   NOTE: Prior to seeing the patient today, I have reviewed the triage nursing documentation and vital signs. Clinical staff has updated patient's PMH/PSHx, current medication list, and drug allergies/intolerances to ensure comprehensive history available to assist in medical decision making.   History:   HPI: [name] Griffin is a 38 y.o. male who presents today with complaints of worsening mid to upper abdominal pain x 3 days. Patient denies any associated nausea, vomiting, or changes to his bowel habits. LNBM was earlier today. He has not appreciated any melena or hematochezia. He has not experienced any fevers or urinary symptoms; PMH (+) urolithiasis. Patient initially observed sitting in chair in exam room rocking back and forth holding his abdomen. He is asking for pain medication for the abdominal pain that he describes as being sharp in nature. He denies any known abdominal issues. He has never had any type of abdominal surgery per his report. Of note, patient has recently been taking a great deal of APAP for dental pain. He reports that he has been taking approximately 1500 to 2000 mg three to four times a day for 3 weeks; last took this much about a week ago. Patient denies the use of ETOH, however he does endorse the use of marijuana. Patient currently on Augmentin and Norco 7.5/325 mg following dental procedure.   Past Medical History:  Diagnosis Date   Depression    Renal disorder    kidney stones    Past Surgical History:  Procedure Laterality Date   NO PAST SURGERIES      Family History  Problem Relation Age of Onset   Aneurysm Mother    Hypertension Mother    Diabetes Mother    Hypertension Father    Diabetes Father    Aneurysm Father    COPD Father    Heart failure  Father     Social History   Tobacco Use   Smoking status: Current Every Day Smoker    Packs/day: 0.25    Types: Cigarettes   Smokeless tobacco: Former NeurosurgeonUser    Types: Chew  Substance Use Topics   Alcohol use: No   Drug use: Yes    Types: Marijuana    There are no active problems to display for this patient.   Home Medications:    Current Meds  Medication Sig   amoxicillin-clavulanate (AUGMENTIN) 875-125 MG tablet Take 1 tablet by mouth 2 (two) times daily.   propranolol ER (INDERAL LA) 60 MG 24 hr capsule Take by mouth.    Allergies:   Sulfa antibiotics  Review of Systems (ROS): Review of Systems  Constitutional: Negative for chills and fever.  Respiratory: Negative for cough and shortness of breath.   Cardiovascular: Negative for chest pain and palpitations.  Gastrointestinal: Positive for abdominal pain. Negative for blood in stool, constipation, diarrhea, nausea and vomiting.  Genitourinary: Negative for dysuria, flank pain, frequency, testicular pain and urgency.  Musculoskeletal: Negative for back pain.  All other systems reviewed and are negative.    Vital Signs: Today's Vitals   03/08/19 1740 03/08/19 1741 03/08/19 1818  BP:  126/87   Pulse:  60   Resp:  18   Temp:  98.3 F (36.8 C)   TempSrc:  Oral   SpO2:  98%   Weight:  130 lb (59  kg)   Height:  5\' 10"  (1.778 m)   PainSc: 7   10-Worst pain ever    Physical Exam: Physical Exam  Constitutional: He is oriented to person, place, and time and well-developed, well-nourished, and in no distress. Vital signs are normal.  Non-toxic appearance.  Observed rocking back and forth holding abdomen; rates pain with provider 8-9/10  HENT:  Head: Normocephalic and atraumatic.  Eyes: Pupils are equal, round, and reactive to light.  Neck: Normal range of motion. Neck supple.  Cardiovascular: Normal rate, regular rhythm, normal heart sounds and intact distal pulses.  Pulmonary/Chest: Effort normal and  breath sounds normal. No respiratory distress.  Abdominal: Soft. Normal appearance and bowel sounds are normal. He exhibits no distension. There is no hepatosplenomegaly. There is abdominal tenderness (sharp) in the epigastric area.  Neurological: He is alert and oriented to person, place, and time.  Skin: Skin is warm and dry. He is not diaphoretic.  Psychiatric: Affect normal. His mood appears anxious.    Urgent Care Treatments / Results:   LABS: PLEASE NOTE: all labs that were ordered this encounter are listed, however only abnormal results are displayed. Labs Reviewed - No data to display  EKG: -None  RADIOLOGY: No results found.  PROCEDURES: Procedures  MEDICATIONS RECEIVED THIS VISIT: Medications - No data to display  PERTINENT CLINICAL COURSE NOTES/UPDATES: Clinical Course as of Mar 07 1834  Thu Mar 08, 2019  1801 Patient report called to [facility] ED staff Jonelle Sidle, RN). Patient needs imaging of his abdomen with contrast. He will be presenting to the ED for further evaluation and imaging via POV.    [BG]    Clinical Course User Index [BG] Karen Kitchens, NP   Initial Impression / Assessment and Plan / Urgent Care Course:  Pertinent labs & imaging results that were available during my care of the patient were personally reviewed by me and considered in my medical decision making (see lab/imaging section of note for values and interpretations).  [name] Griffin is a 38 y.o. male who presents to [facility] today with complaints of Abdominal Pain  Patient overall well appearing and in no acute distress today in clinic. He does appear to be uncomfortable. Discussed need for further evaluation and management of his current abdominal pain. Discussed starting workup here in the urgent care with labs and urine. Patient is requesting pain medications, which I do not have in this setting. Pain is rated 8-9/10 in clinic and patient is noted to be rocking and holding his abdomen.  Discussed having patient seen in the emergency department for further evaluation and management of his pain. Ultimately, the patient is going to required advanced imaging of the abdomen with contrast. Patient indicates that he would like to defer starting workup here in urgent care and proceed to the emergency room at [facility] as discussed, which is felt to be reasonable.   Patient report called to emergency department triage nurse Jonelle Sidle, RN). Advised of presenting complaints, assessment, and plans to discharge patient to [facility] emergency department for continuing care. Patient will be presenting from [place] via POV.   Final Clinical Impressions / Urgent Care Diagnoses:   Final diagnoses:  Abdominal pain, unspecified abdominal location    New Prescriptions:  [place] Controlled Substance Registry consulted? Yes, I have consulted the [place] Controlled Substances Registry for this patient. Will defer pain management to ED provider following completion of workup.   No orders of the defined types were placed in this encounter.  Recommended Follow up Care:   Follow-up Information    Go to  [facility] EMERGENCY DEPARTMENT.   Specialty: Emergency Medicine Contact information: [address] [id number] [address] [place] [address] [phone]        NOTE: This note was prepared using Dragon dictation software along with smaller phrase technology. Despite my best ability to proofread, there is the potential that transcriptional errors may still occur from this process, and are completely unintentional.    Verlee Monte, NP 03/09/19 1516

## 2019-03-08 NOTE — ED Notes (Signed)
Pt states his pain began approx 3 days ago. He denies nausea and has been able to eat and hold down food. Last meal was lunch today. Pt reports bowel movements and urinary frequency/urgency normal for him. Denies fever/chills, vomiting, diarrhea. No previous Hx of similar Sx.

## 2019-03-08 NOTE — ED Notes (Signed)
Pt states he is unable to provided urine specimen at this time; asked per this RN to notify first nurse if he is able to provide specimen while waiting.

## 2019-03-08 NOTE — ED Provider Notes (Signed)
[facility] Emergency Department Provider Note  ____________________________________________   First MD Initiated Contact with Patient 03/08/19 2005     (approximate)  I have reviewed the triage vital signs and the nursing notes.   HISTORY  Chief Complaint Abdominal Pain   HPI Marsden Zaino is a 38 y.o. male here for evaluation of severe abdominal pain  Patient reports 3 days ago began having pain in his mid upper abdomen.  It is been increasing and severe in nature.  Associated with nausea decreased appetite.   No diarrhea.  Denies major previous abdominal surgery.  No vomiting.  No chest pain or shortness of breath.  No exposure to COVID.  Denies chest pain or trouble breathing.  No trouble with his legs.  Denies had issue like this before.  Does have a history of kidney stones in the past.  Went to urgent care they recommend he come to get a CT scan  Past Medical History:  Diagnosis Date  . Depression   . Renal disorder    kidney stones    There are no active problems to display for this patient.   Past Surgical History:  Procedure Laterality Date  . NO PAST SURGERIES      Prior to Admission medications   Medication Sig Start Date End Date Taking? Authorizing Provider  amoxicillin-clavulanate (AUGMENTIN) 875-125 MG tablet Take 1 tablet by mouth 2 (two) times daily.    [provider]  omeprazole (PRILOSEC) 40 MG capsule Take by mouth. 11/15/18 11/15/19  [provider]  ondansetron (ZOFRAN ODT) 4 MG disintegrating tablet Take 1 tablet (4 mg total) by mouth every 6 (six) hours as needed for nausea or vomiting. 03/08/19   Delman Kitten, MD  propranolol ER (INDERAL LA) 60 MG 24 hr capsule Take by mouth. 09/02/17 03/08/19  [provider]  clonazePAM (KLONOPIN) 0.5 MG tablet Take 1 tablet (0.5 mg total) by mouth 2 (two) times daily as needed for anxiety. 10/07/16 11/23/18  Drenda Freeze, MD  QUEtiapine (SEROQUEL XR) 50 MG  TB24 24 hr tablet Take 1 tablet (50 mg total) by mouth daily. 10/07/16 11/23/18  Drenda Freeze, MD    Allergies Sulfa antibiotics  Family History  Problem Relation Age of Onset  . Aneurysm Mother   . Hypertension Mother   . Diabetes Mother   . Hypertension Father   . Diabetes Father   . Aneurysm Father   . COPD Father   . Heart failure Father     Social History Social History   Tobacco Use  . Smoking status: Current Every Day Smoker    Packs/day: 0.25    Types: Cigarettes  . Smokeless tobacco: Former Systems developer    Types: Chew  Substance Use Topics  . Alcohol use: No  . Drug use: Yes    Types: Marijuana    Review of Systems Constitutional: No fever/chills Eyes: No visual changes. ENT: No sore throat. Cardiovascular: Denies chest pain. Respiratory: Denies shortness of breath. Gastrointestinal: See HPI Genitourinary: Negative for dysuria. Musculoskeletal: Negative for back pain. Skin: Negative for rash. Neurological: Negative for headaches, areas of focal weakness or numbness.  He relates he has been taking up to 4 to 8 g of Tylenol daily for about 3 weeks due to dental pain.  However he reduced this dose about a week ago.  ____________________________________________   PHYSICAL EXAM:  VITAL SIGNS: ED Triage Vitals  Enc Vitals Group     BP 03/08/19 1824 (!) 147/93  Pulse Rate 03/08/19 1824 63     Resp 03/08/19 1824 16     Temp 03/08/19 1824 97.9 F (36.6 C)     Temp Source 03/08/19 1824 Oral     SpO2 03/08/19 1824 99 %     Weight 03/08/19 1831 130 lb (59 kg)     Height 03/08/19 1831 5\' 10"  (1.778 m)     Head Circumference --      Peak Flow --      Pain Score 03/08/19 1831 8     Pain Loc --      Pain Edu? --      Excl. in GC? --     Constitutional: Alert and oriented. Well appearing and in no acute distress except he does appear in pain wincing holding his abdomen with his right hand. Eyes: Conjunctivae are normal. Head: Atraumatic. Nose: No  congestion/rhinnorhea. Mouth/Throat: Mucous membranes are moist. Neck: No stridor.  Cardiovascular: Normal rate, regular rhythm. Grossly normal heart sounds.  Good peripheral circulation. Respiratory: Normal respiratory effort.  No retractions. Lungs CTAB. Gastrointestinal: Soft and moderate tenderness primarily in the epigastrium. No distention. Musculoskeletal: No lower extremity tenderness nor edema. Neurologic:  Normal speech and language. No gross focal neurologic deficits are appreciated.  Skin:  Skin is warm, dry and intact. No rash noted. Psychiatric: Mood and affect are normal. Speech and behavior are normal.  ____________________________________________   LABS (all labs ordered are listed, but only abnormal results are displayed)  Labs Reviewed  COMPREHENSIVE METABOLIC PANEL - Abnormal; Notable for the following components:      Result Value   Glucose, Bld 113 (*)    All other components within normal limits  URINALYSIS, COMPLETE (UACMP) WITH MICROSCOPIC - Abnormal; Notable for the following components:   Color, Urine YELLOW (*)    APPearance CLEAR (*)    All other components within normal limits  ACETAMINOPHEN LEVEL - Abnormal; Notable for the following components:   Acetaminophen (Tylenol), Serum <10 (*)    All other components within normal limits  LIPASE, BLOOD  CBC   ____________________________________________  EKG   ____________________________________________  RADIOLOGY  Dg Abdomen 1 View  Result Date: 03/08/2019 CLINICAL DATA:  38 year old male with abdominal pain EXAM: ABDOMEN - 1 VIEW COMPARISON:  None. FINDINGS: The bowel gas pattern is normal. No radio-opaque calculi or other significant radiographic abnormality are seen. IMPRESSION: Negative. Electronically Signed   By: Gilmer MorJaime  Wagner D.O.   On: 03/08/2019 18:59   Ct Abdomen Pelvis W Contrast  Result Date: 03/08/2019 CLINICAL DATA:  Generalized abdominal pain EXAM: CT ABDOMEN AND PELVIS WITH  CONTRAST TECHNIQUE: Multidetector CT imaging of the abdomen and pelvis was performed using the standard protocol following bolus administration of intravenous contrast. CONTRAST:  100mL OMNIPAQUE IOHEXOL 300 MG/ML  SOLN COMPARISON:  04/09/2013 FINDINGS: Lower chest: Lung bases are clear. No effusions. Heart is normal size. Hepatobiliary: No focal hepatic abnormality. Gallbladder unremarkable. Pancreas: No focal abnormality or ductal dilatation. Spleen: No focal abnormality.  Normal size. Adrenals/Urinary Tract: Small cortical cysts in the left kidney. No stones or hydronephrosis. Adrenal glands and urinary bladder unremarkable. Stomach/Bowel: Stomach, large and small bowel grossly unremarkable. Normal appendix. Vascular/Lymphatic: No evidence of aneurysm or adenopathy. Reproductive: No visible focal abnormality. Other: Trace free fluid in the pelvis.  No free air. Musculoskeletal: No acute bony abnormality. IMPRESSION: Normal appendix. Trace free fluid in the pelvis.  Exact cause not identified. Electronically Signed   By: Charlett NoseKevin  Dover M.D.   On: 03/08/2019 22:51  CT reviewed, discussed with surgery Dr. Doreatha Martin Sakia____________________________________________   PROCEDURES  Procedure(s) performed: None  Procedures  Critical Care performed: No  ____________________________________________   INITIAL IMPRESSION / ASSESSMENT AND PLAN / ED COURSE  Pertinent labs & imaging results that were available during my care of the patient were reviewed by me and considered in my medical decision making (see chart for details).   Differential diagnosis includes but is not limited to, abdominal perforation, aortic dissection, cholecystitis, appendicitis, diverticulitis, colitis, esophagitis/gastritis, kidney stone, pyelonephritis, urinary tract infection, aortic aneurysm. All are considered in decision and treatment plan. Based upon the patient's presentation and risk factors, proceed with further work-up   Denies cardiopulmonary or vascular symptoms.  Focal abdominal tenderness mid abdomen.  We will proceed with CT scan to further evaluate for etiology.  Pain control hydration.     ----------------------------------------- 11:42 PM on 03/08/2019 -----------------------------------------  Patient feeling better after second dose of morphine again.  Because of his trace free fluid in the pelvis without clear explanation I obtain a consultation by general surgery, Dr. Tonna Boehringer seeing the patient now for consult.  Verbally discussed case with surgery, advises patient feels improved, patient requesting to be discharged at this time.  Recommend discharge, can follow-up outpatient.  Return precautions and treatment recommendations and follow-up discussed with the patient who is agreeable with the plan.  ____________________________________________   FINAL CLINICAL IMPRESSION(S) / ED DIAGNOSES  Final diagnoses:  Epigastric abdominal pain        Note:  This document was prepared using Dragon voice recognition software and may include unintentional dictation errors       Sharyn Creamer, MD 03/08/19 2350

## 2019-03-09 ENCOUNTER — Emergency Department
Admission: EM | Admit: 2019-03-09 | Discharge: 2019-03-09 | Discharge status: Against Medical Advice | Payer: Medicaid Other | Source: Home / Self Care

## 2019-03-09 DIAGNOSIS — R110 Nausea: Secondary | ICD-10-CM | POA: Insufficient documentation

## 2019-03-09 DIAGNOSIS — Z5321 Procedure and treatment not carried out due to patient leaving prior to being seen by health care provider: Secondary | ICD-10-CM | POA: Insufficient documentation

## 2019-03-09 LAB — COMPREHENSIVE METABOLIC PANEL
ALT: 15 U/L (ref 0–44)
AST: 20 U/L (ref 15–41)
Albumin: 4.2 g/dL (ref 3.5–5.0)
Alkaline Phosphatase: 73 U/L (ref 38–126)
Anion gap: 9 (ref 5–15)
BUN: 11 mg/dL (ref 6–20)
CO2: 24 mmol/L (ref 22–32)
Calcium: 9 mg/dL (ref 8.9–10.3)
Chloride: 104 mmol/L (ref 98–111)
Creatinine, Ser: 0.8 mg/dL (ref 0.61–1.24)
GFR calc Af Amer: >60 mL/min (ref >60)
GFR calc non Af Amer: >60 mL/min (ref >60)
Glucose, Bld: 122 mg/dL — ABNORMAL HIGH (ref 70–99)
Potassium: 3.5 mmol/L (ref 3.5–5.1)
Sodium: 137 mmol/L (ref 135–145)
Total Bilirubin: 0.8 mg/dL (ref 0.3–1.2)
Total Protein: 6.8 g/dL (ref 6.5–8.1)

## 2019-03-09 LAB — CBC
HCT: 41.6 % (ref 39.0–52.0)
Hemoglobin: 14.4 g/dL (ref 13.0–17.0)
MCH: 32.2 pg (ref 26.0–34.0)
MCHC: 34.6 g/dL (ref 30.0–36.0)
MCV: 93.1 fL (ref 80.0–100.0)
Platelets: 147 10*3/uL — ABNORMAL LOW (ref 150–400)
RBC: 4.47 MIL/uL (ref 4.22–5.81)
RDW: 13.1 % (ref 11.5–15.5)
WBC: 6.9 10*3/uL (ref 4.0–10.5)
nRBC: 0 % (ref 0.0–0.2)

## 2019-03-09 LAB — LIPASE, BLOOD: Lipase: 21 U/L (ref 11–51)

## 2019-03-09 MED ORDER — ONDANSETRON 4 MG PO TBDP
4.0000 mg | ORAL_TABLET | Freq: Once | ORAL | Status: AC | PRN
  Administered 2019-03-09: 4 mg via ORAL
  Filled 2019-03-09: qty 1

## 2019-03-09 NOTE — ED Triage Notes (Signed)
Pt states that he was seen last pm for abd pain, pt states that his abd pain is worse today, pt points to the center of his abd, reports 3 episodes of stool today that looked like coffee grounds. Pt reports that he feels like death. Reports nausea, no vomiting

## 2019-03-09 NOTE — ED Notes (Signed)
Patient called up to stat desk to report he was leaving. Patient informed that a patient room was now available and asked to follow RN to room. Patient declined and reported he was going to [facility]. Patient cautioned against the dangers of leaving AMA. Patient verbalized understanding. Patient left.

## 2019-05-21 ENCOUNTER — Other Ambulatory Visit: Payer: Self-pay

## 2019-05-21 ENCOUNTER — Encounter: Payer: Self-pay | Admitting: Gastroenterology

## 2019-05-21 ENCOUNTER — Ambulatory Visit (INDEPENDENT_AMBULATORY_CARE_PROVIDER_SITE_OTHER): Payer: Medicaid Other | Admitting: Gastroenterology

## 2019-05-21 VITALS — BP 128/80 | HR 75 | Temp 98.8°F | Ht 70.0 in | Wt 129.4 lb

## 2019-05-21 DIAGNOSIS — R1032 Left lower quadrant pain: Secondary | ICD-10-CM

## 2019-05-21 DIAGNOSIS — N200 Calculus of kidney: Secondary | ICD-10-CM | POA: Insufficient documentation

## 2019-05-21 DIAGNOSIS — R197 Diarrhea, unspecified: Secondary | ICD-10-CM

## 2019-05-21 DIAGNOSIS — K921 Melena: Secondary | ICD-10-CM

## 2019-05-21 DIAGNOSIS — M24412 Recurrent dislocation, left shoulder: Secondary | ICD-10-CM | POA: Insufficient documentation

## 2019-05-21 MED ORDER — CYCLOBENZAPRINE HCL 10 MG PO TABS: 10.0000 mg | ORAL_TABLET | Freq: Three times a day (TID) | ORAL | 0 refills | Status: DC | PRN

## 2019-05-21 MED ORDER — PEG 3350-KCL-NA BICARB-NACL 420 G PO SOLR: ORAL | 0 refills | Status: DC

## 2019-05-21 NOTE — H&P (View-Only) (Signed)
Gastroenterology Consultation  Referring Provider:     Dione Housekeeper, * Primary Care Physician:  [facility] Primary Gastroenterologist:  Dr. Servando Snare     Reason for Consultation:     Abdominal pain        HPI:   [name] Griffin is a 38 y.o. y/o male referred for consultation & management of abdominal pain by Dr. Dan Humphreys, [facility].  This patient comes to see me today after being seen in the emergency room back in September of the 24th for abdominal pain.  The patient had a CT scan that showed:  IMPRESSION: Normal appendix. Trace free fluid in the pelvis.  Exact cause not identified.  He was also evaluated by surgery who did not think that he had any indications for surgery.  The patient appears to have left AMA and stated that he was going to go to [facility] since nothing had been done for 24 hours he was in the ER and is report of his pain getting worse.  The patient had blood work at that time that showed:  Component     Latest Ref Rng & Units 03/08/2019 03/09/2019  WBC     4.0 - 10.5 K/uL 8.4 6.9  RBC     4.22 - 5.81 MIL/uL 4.38 4.47  Hemoglobin     13.0 - 17.0 g/dL 95.0 93.2  HCT     67.1 - 52.0 % 41.0 41.6  MCV     80.0 - 100.0 fL 93.6 93.1  MCH     26.0 - 34.0 pg 32.0 32.2  MCHC     30.0 - 36.0 g/dL 24.5 80.9  RDW     98.3 - 15.5 % 12.8 13.1  Platelets     150 - 400 K/uL 152 147 (L)  nRBC     0.0 - 0.2 % 0.0 0.0   Component     Latest Ref Rng & Units 03/09/2019  Sodium     135 - 145 mmol/L 137  Potassium     3.5 - 5.1 mmol/L 3.5  Chloride     98 - 111 mmol/L 104  CO2     22 - 32 mmol/L 24  Glucose     70 - 99 mg/dL 382 (H)  BUN     6 - 20 mg/dL 11  Creatinine     5.05 - 1.24 mg/dL 3.97  Calcium     8.9 - 10.3 mg/dL 9.0  Total Protein     6.5 - 8.1 g/dL 6.8  Albumin     3.5 - 5.0 g/dL 4.2  AST     15 - 41 U/L 20  ALT     0 - 44 U/L 15  Alkaline Phosphatase     38 - 126 U/L 73  Total Bilirubin     0.3 - 1.2 mg/dL 0.8     The patient had reported cannabis use during the ER visit.  He reported that the abdominal pain was associated with nausea and decreased appetite.  The patient was then followed up with his primary care physician on a video call and had reported to his PCP that he was told by the ER that he should see a surgeon or gastroenterologist to have his gallbladder taken out because that was likely the cause of his symptoms.  From reviewing the chart it appears that the surgeon had recommended further evaluation of the gallbladder but not a cholecystectomy.  The patient reported that  the pain was worse when he would sit down to drive or when he would bend over.  Past Medical History:  Diagnosis Date  . Depression   . Renal disorder    kidney stones    Past Surgical History:  Procedure Laterality Date  . NO PAST SURGERIES      Prior to Admission medications   Medication Sig Start Date End Date Taking? Authorizing Provider  amoxicillin-clavulanate (AUGMENTIN) 875-125 MG tablet Take 1 tablet by mouth 2 (two) times daily.    [provider]  ondansetron (ZOFRAN ODT) 4 MG disintegrating tablet Take 1 tablet (4 mg total) by mouth every 6 (six) hours as needed for nausea or vomiting. 03/08/19   Delman Kitten, MD  propranolol ER (INDERAL LA) 60 MG 24 hr capsule Take by mouth. 09/02/17 03/08/19  [provider]  clonazePAM (KLONOPIN) 0.5 MG tablet Take 1 tablet (0.5 mg total) by mouth 2 (two) times daily as needed for anxiety. 10/07/16 11/23/18  Drenda Freeze, MD  omeprazole (PRILOSEC) 40 MG capsule Take by mouth. 11/15/18 03/09/19  [provider]  QUEtiapine (SEROQUEL XR) 50 MG TB24 24 hr tablet Take 1 tablet (50 mg total) by mouth daily. 10/07/16 11/23/18  Drenda Freeze, MD    Family History  Problem Relation Age of Onset  . Aneurysm Mother   . Hypertension Mother   . Diabetes Mother   . Hypertension Father   . Diabetes Father   . Aneurysm Father   . COPD Father   . Heart  failure Father      Social History   Tobacco Use  . Smoking status: Current Every Day Smoker    Packs/day: 0.25    Types: Cigarettes  . Smokeless tobacco: Former Systems developer    Types: Chew  Substance Use Topics  . Alcohol use: No  . Drug use: Yes    Types: Marijuana    Allergies as of 05/21/2019 - Review Complete 03/09/2019  Allergen Reaction Noted  . Sulfa antibiotics Swelling 01/09/2015    Review of Systems:    All systems reviewed and negative except where noted in HPI.   Physical Exam:  There were no vitals taken for this visit. No LMP for male patient. General:   Alert,  Well-developed, well-nourished, pleasant and cooperative in NAD Head:  Normocephalic and atraumatic. Eyes:  Sclera clear, no icterus.   Conjunctiva pink. Ears:  Normal auditory acuity. Nose:  No deformity, discharge, or lesions. Mouth:  No deformity or lesions,oropharynx pink & moist. Neck:  Supple; no masses or thyromegaly. Lungs:  Respirations even and unlabored.  Clear throughout to auscultation.   No wheezes, crackles, or rhonchi. No acute distress. Heart:  Regular rate and rhythm; no murmurs, clicks, rubs, or gallops. Abdomen:  Normal bowel sounds.  No bruits.  Soft, positive tenderness to raising his legs a few inches above the exam table with and without palpating his abdomen and non-distended without masses, hepatosplenomegaly or hernias noted.  No guarding or rebound tenderness.  Positive Carnett sign.   Rectal:  Deferred.  Msk:  Symmetrical without gross deformities.  Good, equal movement & strength bilaterally. Pulses:  Normal pulses noted. Extremities:  No clubbing or edema.  No cyanosis. Neurologic:  Alert and oriented x3;  grossly normal neurologically. Skin:  Intact without significant lesions or rashes.  No jaundice. Lymph Nodes:  No significant cervical adenopathy. Psych:  Alert and cooperative. Normal mood and affect.  Imaging Studies: No results found.  Assessment and Plan:  [name] is a 38 y.o. y/o male who comes in today after being seen in the ER for abdominal pain and evaluated by surgery.  The patient has a physical exam with clear musculoskeletal discomfort which is reproducible with a straight leg lift.  With the addition of one finger palpation of the abdominal wall muscles the patient reported the discomfort to be excruciating.  He has lost approximately 7 pounds and reports that he has been having diarrhea with blood in his stool.  The patient will be started on Flexeril for his musculoskeletal symptoms and will be set up for an EGD and colonoscopy due to his diarrhea rectal bleeding and postprandial abdominal pain.  The patient has been told that he should not drive while taking the Flexeril because it can cause him to be drowsy.  He will also have his stool sent off for possible pathogens. I have discussed risks & benefits which include, but are not limited to, bleeding, infection, perforation & drug reaction.  The patient agrees with this plan & written consent will be obtained.      Amarissa Koerner, MD. FACG    Note: This dictation was prepared with Dragon dictation along with smaller phrase technology. Any transcriptional errors that result from this process are unintentional.   

## 2019-05-21 NOTE — Progress Notes (Signed)
Gastroenterology Consultation  Referring Provider:     Dione Housekeeper, * Primary Care Physician:  [facility] Primary Gastroenterologist:  Dr. Servando Snare     Reason for Consultation:     Abdominal pain        HPI:   [name] Griffin is a 38 y.o. y/o male referred for consultation & management of abdominal pain by Dr. Dan Humphreys, [facility].  This patient comes to see me today after being seen in the emergency room back in September of the 24th for abdominal pain.  The patient had a CT scan that showed:  IMPRESSION: Normal appendix. Trace free fluid in the pelvis.  Exact cause not identified.  He was also evaluated by surgery who did not think that he had any indications for surgery.  The patient appears to have left AMA and stated that he was going to go to [facility] since nothing had been done for 24 hours he was in the ER and is report of his pain getting worse.  The patient had blood work at that time that showed:  Component     Latest Ref Rng & Units 03/08/2019 03/09/2019  WBC     4.0 - 10.5 K/uL 8.4 6.9  RBC     4.22 - 5.81 MIL/uL 4.38 4.47  Hemoglobin     13.0 - 17.0 g/dL 95.0 93.2  HCT     67.1 - 52.0 % 41.0 41.6  MCV     80.0 - 100.0 fL 93.6 93.1  MCH     26.0 - 34.0 pg 32.0 32.2  MCHC     30.0 - 36.0 g/dL 24.5 80.9  RDW     98.3 - 15.5 % 12.8 13.1  Platelets     150 - 400 K/uL 152 147 (L)  nRBC     0.0 - 0.2 % 0.0 0.0   Component     Latest Ref Rng & Units 03/09/2019  Sodium     135 - 145 mmol/L 137  Potassium     3.5 - 5.1 mmol/L 3.5  Chloride     98 - 111 mmol/L 104  CO2     22 - 32 mmol/L 24  Glucose     70 - 99 mg/dL 382 (H)  BUN     6 - 20 mg/dL 11  Creatinine     5.05 - 1.24 mg/dL 3.97  Calcium     8.9 - 10.3 mg/dL 9.0  Total Protein     6.5 - 8.1 g/dL 6.8  Albumin     3.5 - 5.0 g/dL 4.2  AST     15 - 41 U/L 20  ALT     0 - 44 U/L 15  Alkaline Phosphatase     38 - 126 U/L 73  Total Bilirubin     0.3 - 1.2 mg/dL 0.8     The patient had reported cannabis use during the ER visit.  He reported that the abdominal pain was associated with nausea and decreased appetite.  The patient was then followed up with his primary care physician on a video call and had reported to his PCP that he was told by the ER that he should see a surgeon or gastroenterologist to have his gallbladder taken out because that was likely the cause of his symptoms.  From reviewing the chart it appears that the surgeon had recommended further evaluation of the gallbladder but not a cholecystectomy.  The patient reported that  the pain was worse when he would sit down to drive or when he would bend over.  Past Medical History:  Diagnosis Date  . Depression   . Renal disorder    kidney stones    Past Surgical History:  Procedure Laterality Date  . NO PAST SURGERIES      Prior to Admission medications   Medication Sig Start Date End Date Taking? Authorizing Provider  amoxicillin-clavulanate (AUGMENTIN) 875-125 MG tablet Take 1 tablet by mouth 2 (two) times daily.    [provider]  ondansetron (ZOFRAN ODT) 4 MG disintegrating tablet Take 1 tablet (4 mg total) by mouth every 6 (six) hours as needed for nausea or vomiting. 03/08/19   Delman Kitten, MD  propranolol ER (INDERAL LA) 60 MG 24 hr capsule Take by mouth. 09/02/17 03/08/19  [provider]  clonazePAM (KLONOPIN) 0.5 MG tablet Take 1 tablet (0.5 mg total) by mouth 2 (two) times daily as needed for anxiety. 10/07/16 11/23/18  Drenda Freeze, MD  omeprazole (PRILOSEC) 40 MG capsule Take by mouth. 11/15/18 03/09/19  [provider]  QUEtiapine (SEROQUEL XR) 50 MG TB24 24 hr tablet Take 1 tablet (50 mg total) by mouth daily. 10/07/16 11/23/18  Drenda Freeze, MD    Family History  Problem Relation Age of Onset  . Aneurysm Mother   . Hypertension Mother   . Diabetes Mother   . Hypertension Father   . Diabetes Father   . Aneurysm Father   . COPD Father   . Heart  failure Father      Social History   Tobacco Use  . Smoking status: Current Every Day Smoker    Packs/day: 0.25    Types: Cigarettes  . Smokeless tobacco: Former Systems developer    Types: Chew  Substance Use Topics  . Alcohol use: No  . Drug use: Yes    Types: Marijuana    Allergies as of 05/21/2019 - Review Complete 03/09/2019  Allergen Reaction Noted  . Sulfa antibiotics Swelling 01/09/2015    Review of Systems:    All systems reviewed and negative except where noted in HPI.   Physical Exam:  There were no vitals taken for this visit. No LMP for male patient. General:   Alert,  Well-developed, well-nourished, pleasant and cooperative in NAD Head:  Normocephalic and atraumatic. Eyes:  Sclera clear, no icterus.   Conjunctiva pink. Ears:  Normal auditory acuity. Nose:  No deformity, discharge, or lesions. Mouth:  No deformity or lesions,oropharynx pink & moist. Neck:  Supple; no masses or thyromegaly. Lungs:  Respirations even and unlabored.  Clear throughout to auscultation.   No wheezes, crackles, or rhonchi. No acute distress. Heart:  Regular rate and rhythm; no murmurs, clicks, rubs, or gallops. Abdomen:  Normal bowel sounds.  No bruits.  Soft, positive tenderness to raising his legs a few inches above the exam table with and without palpating his abdomen and non-distended without masses, hepatosplenomegaly or hernias noted.  No guarding or rebound tenderness.  Positive Carnett sign.   Rectal:  Deferred.  Msk:  Symmetrical without gross deformities.  Good, equal movement & strength bilaterally. Pulses:  Normal pulses noted. Extremities:  No clubbing or edema.  No cyanosis. Neurologic:  Alert and oriented x3;  grossly normal neurologically. Skin:  Intact without significant lesions or rashes.  No jaundice. Lymph Nodes:  No significant cervical adenopathy. Psych:  Alert and cooperative. Normal mood and affect.  Imaging Studies: No results found.  Assessment and Plan:  Donzetta MattersJoshua Griffin is a 38 y.o. y/o male who comes in today after being seen in the ER for abdominal pain and evaluated by surgery.  The patient has a physical exam with clear musculoskeletal discomfort which is reproducible with a straight leg lift.  With the addition of one finger palpation of the abdominal wall muscles the patient reported the discomfort to be excruciating.  He has lost approximately 7 pounds and reports that he has been having diarrhea with blood in his stool.  The patient will be started on Flexeril for his musculoskeletal symptoms and will be set up for an EGD and colonoscopy due to his diarrhea rectal bleeding and postprandial abdominal pain.  The patient has been told that he should not drive while taking the Flexeril because it can cause him to be drowsy.  He will also have his stool sent off for possible pathogens. I have discussed risks & benefits which include, but are not limited to, bleeding, infection, perforation & drug reaction.  The patient agrees with this plan & written consent will be obtained.      Midge Miniumarren Yashica Sterbenz, MD. Clementeen GrahamFACG    Note: This dictation was prepared with Dragon dictation along with smaller phrase technology. Any transcriptional errors that result from this process are unintentional.

## 2019-05-22 ENCOUNTER — Other Ambulatory Visit
Admission: RE | Admit: 2019-05-22 | Discharge: 2019-05-22 | Disposition: A | Discharge status: Home / Self Care | Payer: Medicaid Other | Source: Ambulatory Visit | Attending: Gastroenterology | Admitting: Gastroenterology

## 2019-05-22 DIAGNOSIS — Z01812 Encounter for preprocedural laboratory examination: Secondary | ICD-10-CM | POA: Insufficient documentation

## 2019-05-22 DIAGNOSIS — Z20828 Contact with and (suspected) exposure to other viral communicable diseases: Secondary | ICD-10-CM | POA: Diagnosis not present

## 2019-05-23 LAB — SARS CORONAVIRUS 2 (TAT 6-24 HRS): SARS Coronavirus 2: NEGATIVE

## 2019-05-24 ENCOUNTER — Encounter: Payer: Self-pay | Admitting: Gastroenterology

## 2019-05-25 ENCOUNTER — Encounter: Payer: Self-pay | Admitting: Certified Registered Nurse Anesthetist

## 2019-05-25 ENCOUNTER — Ambulatory Visit: Admission: RE | Admit: 2019-05-25 | Payer: Medicaid Other | Source: Home / Self Care | Admitting: Gastroenterology

## 2019-05-25 ENCOUNTER — Other Ambulatory Visit: Payer: Self-pay

## 2019-05-25 ENCOUNTER — Telehealth: Payer: Self-pay | Admitting: Gastroenterology

## 2019-05-25 SURGERY — COLONOSCOPY WITH PROPOFOL
Anesthesia: General

## 2019-05-25 MED ORDER — LIDOCAINE HCL (PF) 2 % IJ SOLN
INTRAMUSCULAR | Status: AC
  Filled 2019-05-25: qty 10

## 2019-05-25 MED ORDER — PROPOFOL 500 MG/50ML IV EMUL
INTRAVENOUS | Status: AC
  Filled 2019-05-25: qty 50

## 2019-05-25 MED ORDER — PEG 3350-KCL-NA BICARB-NACL 420 G PO SOLR: ORAL | 0 refills | Status: DC

## 2019-05-25 NOTE — Anesthesia Post-op Follow-up Note (Signed)
Anesthesia QCDR form completed.        

## 2019-05-25 NOTE — Telephone Encounter (Signed)
Spoke with pt and due to a family emergency, pt had to reschedule his procedures to 05/29/19. Resent bowel prep to pharmacy.

## 2019-05-25 NOTE — Telephone Encounter (Signed)
Pt left vm he had a procedure today he missed due to family emergency he like to reschedule it he did use his prep so he will need a new one please call pt to reschedule to possible Monday

## 2019-05-29 ENCOUNTER — Ambulatory Visit: Payer: Medicaid Other | Admitting: Certified Registered Nurse Anesthetist

## 2019-05-29 ENCOUNTER — Other Ambulatory Visit: Payer: Self-pay

## 2019-05-29 ENCOUNTER — Encounter: Payer: Self-pay | Admitting: Gastroenterology

## 2019-05-29 ENCOUNTER — Ambulatory Visit
Admission: RE | Admit: 2019-05-29 | Discharge: 2019-05-29 | Disposition: A | Discharge status: Home / Self Care | Payer: Medicaid Other | Attending: Gastroenterology | Admitting: Gastroenterology

## 2019-05-29 ENCOUNTER — Encounter
Admission: RE | Disposition: A | Discharge status: Home / Self Care | Payer: Self-pay | Source: Home / Self Care | Attending: Gastroenterology

## 2019-05-29 DIAGNOSIS — Z882 Allergy status to sulfonamides status: Secondary | ICD-10-CM | POA: Diagnosis not present

## 2019-05-29 DIAGNOSIS — F329 Major depressive disorder, single episode, unspecified: Secondary | ICD-10-CM | POA: Diagnosis not present

## 2019-05-29 DIAGNOSIS — Z888 Allergy status to other drugs, medicaments and biological substances status: Secondary | ICD-10-CM | POA: Diagnosis not present

## 2019-05-29 DIAGNOSIS — R197 Diarrhea, unspecified: Secondary | ICD-10-CM | POA: Diagnosis not present

## 2019-05-29 DIAGNOSIS — K529 Noninfective gastroenteritis and colitis, unspecified: Secondary | ICD-10-CM | POA: Insufficient documentation

## 2019-05-29 DIAGNOSIS — Z79899 Other long term (current) drug therapy: Secondary | ICD-10-CM | POA: Insufficient documentation

## 2019-05-29 DIAGNOSIS — Z885 Allergy status to narcotic agent status: Secondary | ICD-10-CM | POA: Diagnosis not present

## 2019-05-29 DIAGNOSIS — K921 Melena: Secondary | ICD-10-CM

## 2019-05-29 DIAGNOSIS — M1990 Unspecified osteoarthritis, unspecified site: Secondary | ICD-10-CM | POA: Insufficient documentation

## 2019-05-29 DIAGNOSIS — R1013 Epigastric pain: Secondary | ICD-10-CM | POA: Diagnosis not present

## 2019-05-29 DIAGNOSIS — F17210 Nicotine dependence, cigarettes, uncomplicated: Secondary | ICD-10-CM | POA: Diagnosis not present

## 2019-05-29 HISTORY — PX: COLONOSCOPY WITH PROPOFOL: SHX5780

## 2019-05-29 HISTORY — PX: ESOPHAGOGASTRODUODENOSCOPY (EGD) WITH PROPOFOL: SHX5813

## 2019-05-29 HISTORY — DX: Pain in unspecified shoulder: M25.519

## 2019-05-29 SURGERY — COLONOSCOPY WITH PROPOFOL
Anesthesia: General

## 2019-05-29 MED ORDER — MIDAZOLAM HCL 2 MG/2ML IJ SOLN
INTRAMUSCULAR | Status: AC
  Filled 2019-05-29: qty 2

## 2019-05-29 MED ORDER — PROPOFOL 10 MG/ML IV BOLUS
INTRAVENOUS | Status: DC | PRN
  Administered 2019-05-29: 18 mg via INTRAVENOUS
  Administered 2019-05-29: 80 mg via INTRAVENOUS
  Administered 2019-05-29: 18 mg via INTRAVENOUS

## 2019-05-29 MED ORDER — PROPOFOL 500 MG/50ML IV EMUL
INTRAVENOUS | Status: AC
  Filled 2019-05-29: qty 50

## 2019-05-29 MED ORDER — FENTANYL CITRATE (PF) 100 MCG/2ML IJ SOLN
INTRAMUSCULAR | Status: DC | PRN
  Administered 2019-05-29 (×2): 50 ug via INTRAVENOUS

## 2019-05-29 MED ORDER — PROPOFOL 500 MG/50ML IV EMUL
INTRAVENOUS | Status: DC | PRN
  Administered 2019-05-29: 140 ug/kg/min via INTRAVENOUS

## 2019-05-29 MED ORDER — LIDOCAINE HCL (CARDIAC) PF 100 MG/5ML IV SOSY
PREFILLED_SYRINGE | INTRAVENOUS | Status: DC | PRN
  Administered 2019-05-29: 50 mg via INTRAVENOUS

## 2019-05-29 MED ORDER — MIDAZOLAM HCL 2 MG/2ML IJ SOLN
INTRAMUSCULAR | Status: DC | PRN
  Administered 2019-05-29 (×2): 2 mg via INTRAVENOUS

## 2019-05-29 MED ORDER — FENTANYL CITRATE (PF) 100 MCG/2ML IJ SOLN
INTRAMUSCULAR | Status: AC
  Filled 2019-05-29: qty 2

## 2019-05-29 MED ORDER — SODIUM CHLORIDE 0.9 % IV SOLN: INTRAVENOUS | Status: DC

## 2019-05-29 NOTE — Op Note (Signed)
[facility] Gastroenterology Patient Name: [name] Griffin Procedure Date: 05/29/2019 11:37 AM MRN: [id number] Account #: [id number] Date of Birth: 1980-12-03 Admit Type: Outpatient Age: [age] Room: [facility] ENDO ROOM 4 Gender: Male Note Status: Finalized Procedure:             Colonoscopy Indications:           Chronic diarrhea, Hematochezia Providers:             Midge Miniumarren Panhia Karl MD, MD Referring MD:          [name] (Referring MD) Medicines:             Propofol per Anesthesia Complications:         No immediate complications. Procedure:             Pre-Anesthesia Assessment:                        - Prior to the procedure, a History and Physical was                         performed, and patient medications and allergies were                         reviewed. The patient's tolerance of previous                         anesthesia was also reviewed. The risks and benefits                         of the procedure and the sedation options and risks                         were discussed with the patient. All questions were                         answered, and informed consent was obtained. Prior                         Anticoagulants: The patient has taken no previous                         anticoagulant or antiplatelet agents. ASA Grade                         Assessment: II - A patient with mild systemic disease.                         After reviewing the risks and benefits, the patient                         was deemed in satisfactory condition to undergo the                         procedure.                        After obtaining informed consent, the colonoscope was  passed under direct vision. Throughout the procedure,                         the patient's blood pressure, pulse, and oxygen                         saturations were monitored continuously. The                         Colonoscope was introduced through the anus and                          advanced to the the terminal ileum. The colonoscopy                         was performed without difficulty. The patient                         tolerated the procedure well. The quality of the bowel                         preparation was excellent. Findings:      The perianal and digital rectal examinations were normal.      The terminal ileum appeared normal. Biopsies were taken with a cold       forceps for histology.      The colon (entire examined portion) appeared normal. Random biopsies       were obtained in the entire colon with cold forceps for histology. Impression:            - The examined portion of the ileum was normal.                         Biopsied.                        - The entire examined colon is normal.                        - Random biopsies were obtained in the entire colon. Recommendation:        - Discharge patient to home.                        - Resume previous diet.                        - Continue present medications.                        - Await pathology results. Procedure Code(s):     --- Professional ---                        [phone], Colonoscopy, flexible; with biopsy, single or                         multiple Diagnosis Code(s):     --- Professional ---                        K92.1, Melena (includes Hematochezia)  K52.9, Noninfective gastroenteritis and colitis,                         unspecified CPT copyright 2019 American Medical Association. All rights reserved. The codes documented in this report are preliminary and upon coder review may  be revised to meet current compliance requirements. Midge Minium MD, MD 05/29/2019 12:08:59 PM This report has been signed electronically. Number of Addenda: 0 Note Initiated On: 05/29/2019 11:37 AM Scope Withdrawal Time: 0 hours 5 minutes 30 seconds  Total Procedure Duration: 0 hours 7 minutes 17 seconds  Estimated Blood Loss:  Estimated blood loss:  none.      [facility]

## 2019-05-29 NOTE — Interval H&P Note (Signed)
History and Physical Interval Note:  05/29/2019 11:09 AM  [name] Griffin  has presented today for surgery, with the diagnosis of DIARRHEA HEMATOCHEZIA ABDOMAL PAIN.  The various methods of treatment have been discussed with the patient and family. After consideration of risks, benefits and other options for treatment, the patient has consented to  Procedure(s): COLONOSCOPY WITH PROPOFOL (N/A) ESOPHAGOGASTRODUODENOSCOPY (EGD) WITH PROPOFOL (N/A) as a surgical intervention.  The patient's history has been reviewed, patient examined, no change in status, stable for surgery.  I have reviewed the patient's chart and labs.  Questions were answered to the patient's satisfaction.     Eleanora Guinyard [employer]

## 2019-05-29 NOTE — Transfer of Care (Signed)
Immediate Anesthesia Transfer of Care Note  Patient: Ermon Sagan  Procedure(s) Performed: COLONOSCOPY WITH PROPOFOL (N/A ) ESOPHAGOGASTRODUODENOSCOPY (EGD) WITH PROPOFOL (N/A )  Patient Location: PACU and Endoscopy Unit  Anesthesia Type:General  Level of Consciousness: drowsy  Airway & Oxygen Therapy: Patient Spontanous Breathing  Post-op Assessment: Report given to RN and Post -op Vital signs reviewed and stable  Post vital signs: Reviewed and stable  Last Vitals:  Vitals Value Taken Time  BP 98/75 05/29/19 1211  Temp    Pulse 77 05/29/19 1211  Resp 18 05/29/19 1211  SpO2 93 % 05/29/19 1211  Vitals shown include unvalidated device data.  Last Pain:  Vitals:   05/29/19 1038  PainSc: 0-No pain         Complications: No apparent anesthesia complications

## 2019-05-29 NOTE — Anesthesia Preprocedure Evaluation (Addendum)
Anesthesia Evaluation  Patient identified by MRN, date of birth, ID band Patient awake    Reviewed: Allergy & Precautions, NPO status , Patient's Chart, lab work & pertinent test results  Airway Mallampati: II  TM Distance: >3 FB     Dental  (+) Chipped   Pulmonary Current Smoker,    Pulmonary exam normal        Cardiovascular negative cardio ROS Normal cardiovascular exam     Neuro/Psych PSYCHIATRIC DISORDERS Anxiety Depression negative neurological ROS     GI/Hepatic Neg liver ROS,   Endo/Other  negative endocrine ROS  Renal/GU   negative genitourinary   Musculoskeletal  (+) Arthritis , Osteoarthritis,    Abdominal Normal abdominal exam  (+)   Peds negative pediatric ROS (+)  Hematology negative hematology ROS (+)   Anesthesia Other Findings Past Medical History: No date: Depression No date: Renal disorder     Comment:  kidney stones No date: Shoulder pain  Reproductive/Obstetrics                            Anesthesia Physical Anesthesia Plan  ASA: II  Anesthesia Plan: General   Post-op Pain Management:    Induction: Intravenous  PONV Risk Score and Plan:   Airway Management Planned: Nasal Cannula  Additional Equipment:   Intra-op Plan:   Post-operative Plan:   Informed Consent: I have reviewed the patients History and Physical, chart, labs and discussed the procedure including the risks, benefits and alternatives for the proposed anesthesia with the patient or authorized representative who has indicated his/her understanding and acceptance.     Dental advisory given  Plan Discussed with: CRNA and Surgeon  Anesthesia Plan Comments:         Anesthesia Quick Evaluation

## 2019-05-29 NOTE — Op Note (Signed)
[facility] Gastroenterology Patient Name: [name] MattersJoshua Arons Procedure Date: 05/29/2019 11:38 AM MRN: [id number] Account #: [id number] Date of Birth: June 17, 1980 Admit Type: Outpatient Age: 38 Room[facility] ROOM 4 Gender: Male Note Status: Finalized Procedure:             Upper GI endoscopy Indications:           Epigastric abdominal pain, Diarrhea Providers:             Midge Miniumarren Boluwatife Flight MD, MD Referring MD:          [name] (Referring MD) Medicines:             Propofol per Anesthesia Complications:         No immediate complications. Procedure:             Pre-Anesthesia Assessment:                        - Prior to the procedure, a History and Physical was                         performed, and patient medications and allergies were                         reviewed. The patient's tolerance of previous                         anesthesia was also reviewed. The risks and benefits                         of the procedure and the sedation options and risks                         were discussed with the patient. All questions were                         answered, and informed consent was obtained. Prior                         Anticoagulants: The patient has taken no previous                         anticoagulant or antiplatelet agents. ASA Grade                         Assessment: II - A patient with mild systemic disease.                         After reviewing the risks and benefits, the patient                         was deemed in satisfactory condition to undergo the                         procedure.                        After obtaining informed consent, the endoscope was  passed under direct vision. Throughout the procedure,                         the patient's blood pressure, pulse, and oxygen                         saturations were monitored continuously. The Endoscope                         was introduced through the  mouth, and advanced to the                         second part of duodenum. The upper GI endoscopy was                         accomplished without difficulty. The patient tolerated                         the procedure well. Findings:      The examined esophagus was normal.      The entire examined stomach was normal.      The examined duodenum was normal. Biopsies were taken with a cold       forceps for histology. Impression:            - Normal esophagus.                        - Normal stomach.                        - Normal examined duodenum. Biopsied. Recommendation:        - Discharge patient to home.                        - Resume previous diet.                        - Continue present medications.                        - Await pathology results.                        - Perform a colonoscopy today. Procedure Code(s):     --- Professional ---                        [phone], Esophagogastroduodenoscopy, flexible,                         transoral; with biopsy, single or multiple Diagnosis Code(s):     --- Professional ---                        R10.13, Epigastric pain                        R19.7, Diarrhea, unspecified CPT copyright 2019 American Medical Association. All rights reserved. The codes documented in this report are preliminary and upon coder review may  be revised to meet current compliance requirements. Lucilla Lame MD, MD 05/29/2019 11:57:59 AM This report has been signed electronically. Number  of Addenda: 0 Note Initiated On: 05/29/2019 11:38 AM Estimated Blood Loss:  Estimated blood loss: none.      [facility]

## 2019-05-29 NOTE — Anesthesia Postprocedure Evaluation (Signed)
Anesthesia Post Note  Patient: [name] Griffin  Procedure(s) Performed: COLONOSCOPY WITH PROPOFOL (N/A ) ESOPHAGOGASTRODUODENOSCOPY (EGD) WITH PROPOFOL (N/A )  Patient location during evaluation: Endoscopy Anesthesia Type: General Level of consciousness: awake and alert and oriented Pain management: pain level controlled Vital Signs Assessment: post-procedure vital signs reviewed and stable Respiratory status: spontaneous breathing Cardiovascular status: blood pressure returned to baseline Anesthetic complications: no     Last Vitals:  Vitals:   05/29/19 1231 05/29/19 1241  BP: 117/86 (!) 119/100  Pulse:    Resp:    Temp:    SpO2:      Last Pain:  Vitals:   05/29/19 1221  TempSrc:   PainSc: 8                  [name]

## 2019-05-29 NOTE — Anesthesia Post-op Follow-up Note (Signed)
Anesthesia QCDR form completed.        

## 2019-05-30 ENCOUNTER — Encounter: Payer: Self-pay | Admitting: *Deleted

## 2019-05-30 ENCOUNTER — Encounter: Payer: Self-pay | Admitting: Gastroenterology

## 2019-05-30 LAB — SURGICAL PATHOLOGY

## 2019-07-18 ENCOUNTER — Other Ambulatory Visit: Payer: Self-pay | Admitting: Family Medicine

## 2019-07-18 ENCOUNTER — Ambulatory Visit
Admission: EM | Admit: 2019-07-18 | Discharge: 2019-07-18 | Disposition: A | Discharge status: Home / Self Care | Payer: Medicaid Other | Attending: Family Medicine | Admitting: Family Medicine

## 2019-07-18 ENCOUNTER — Other Ambulatory Visit: Payer: Self-pay

## 2019-07-18 ENCOUNTER — Encounter: Payer: Self-pay | Admitting: Emergency Medicine

## 2019-07-18 DIAGNOSIS — J40 Bronchitis, not specified as acute or chronic: Secondary | ICD-10-CM | POA: Diagnosis not present

## 2019-07-18 MED ORDER — ALBUTEROL SULFATE HFA 108 (90 BASE) MCG/ACT IN AERS: 1.0000 | INHALATION_SPRAY | Freq: Four times a day (QID) | RESPIRATORY_TRACT | 0 refills | Status: AC | PRN

## 2019-07-18 MED ORDER — PREDNISONE 50 MG PO TABS: ORAL_TABLET | ORAL | 0 refills | Status: AC

## 2019-07-18 MED ORDER — DOXYCYCLINE HYCLATE 100 MG PO CAPS: 100.0000 mg | ORAL_CAPSULE | Freq: Two times a day (BID) | ORAL | 0 refills | Status: AC

## 2019-07-18 NOTE — ED Triage Notes (Signed)
Patient c/o cough x 1 month. He states he has been tested for COVID twice now and it has been negative. He states he can't go to work because of his cough. He is requesting antibiotics and Prednisone.

## 2019-07-18 NOTE — Discharge Instructions (Signed)
Stop smoking.  Medication as prescribed.  Take care  Dr. Adriana Simas

## 2019-07-18 NOTE — ED Provider Notes (Signed)
[name]    CSN: [id number] Arrival date & time: 07/18/19  1141      History   Chief Complaint Chief Complaint  Patient presents with  . Cough   HPI  39 year old male was a current smoker presents with cough for the past month.  Patient reports ongoing cough.  Has been tested for Covid twice and has been negative.  Continues to have cough.  Cough is productive.  He feels chest congestion as well.  Continues to smoke.  No known exacerbating or relieving factors.  No fever or reported shortness of breath.  No other associated symptoms.  No other complaints or concerns at this time.  Past Medical History:  Diagnosis Date  . Depression   . Renal disorder    kidney stones  . Shoulder pain    Patient Active Problem List   Diagnosis Date Noted  . Abdominal pain, epigastric   . Diarrhea   . Blood in stool   . Kidney stone 05/21/2019  . Shoulder dislocation, recurrent, left 05/21/2019  . Contusion of right foot 11/07/2018  . Closed fracture of rib of left side with routine healing 02/17/2017  . Depression with anxiety 02/17/2017  . Tobacco use 02/17/2017  . Shoulder joint pain 11/01/2016    Past Surgical History:  Procedure Laterality Date  . COLONOSCOPY WITH PROPOFOL N/A 05/29/2019   Procedure: COLONOSCOPY WITH PROPOFOL;  Surgeon: Lucilla Lame, MD;  Location: [facility] ENDOSCOPY;  Service: Endoscopy;  Laterality: N/A;  . ESOPHAGOGASTRODUODENOSCOPY (EGD) WITH PROPOFOL N/A 05/29/2019   Procedure: ESOPHAGOGASTRODUODENOSCOPY (EGD) WITH PROPOFOL;  Surgeon: Lucilla Lame, MD;  Location: [facility] ENDOSCOPY;  Service: Endoscopy;  Laterality: N/A;  . NO PAST SURGERIES      Home Medications    Prior to Admission medications   Medication Sig Start Date End Date Taking? Authorizing Provider  albuterol (VENTOLIN HFA) 108 (90 Base) MCG/ACT inhaler Inhale 1-2 puffs into the lungs every 6 (six) hours as needed for wheezing or shortness of breath. 07/18/19   Coral Spikes, DO   doxycycline (VIBRAMYCIN) 100 MG capsule Take 1 capsule (100 mg total) by mouth 2 (two) times daily. 07/18/19   Coral Spikes, DO  predniSONE (DELTASONE) 50 MG tablet 1 tablet daily x 5 days 07/18/19   Coral Spikes, DO  propranolol ER (INDERAL LA) 60 MG 24 hr capsule Take by mouth. 09/02/17 03/08/19  [provider]  clonazePAM (KLONOPIN) 0.5 MG tablet Take 1 tablet (0.5 mg total) by mouth 2 (two) times daily as needed for anxiety. 10/07/16 11/23/18  Drenda Freeze, MD  omeprazole (PRILOSEC) 40 MG capsule Take by mouth. 11/15/18 03/09/19  [provider]  QUEtiapine (SEROQUEL XR) 50 MG TB24 24 hr tablet Take 1 tablet (50 mg total) by mouth daily. 10/07/16 11/23/18  Drenda Freeze, MD    Family History Family History  Problem Relation Age of Onset  . Aneurysm Mother   . Hypertension Mother   . Diabetes Mother   . Hypertension Father   . Diabetes Father   . Aneurysm Father   . COPD Father   . Heart failure Father     Social History Social History   Tobacco Use  . Smoking status: Current Every Day Smoker    Packs/day: 0.25    Types: Cigarettes  . Smokeless tobacco: Former Systems developer    Types: Chew  Substance Use Topics  . Alcohol use: Not Currently  . Drug use: Not Currently    Types: Marijuana  Allergies   Sulfa antibiotics, Quetiapine, Sulfasalazine, and Tramadol   Review of Systems Review of Systems  Constitutional: Negative for fever.  Respiratory: Positive for cough.    Physical Exam Triage Vital Signs ED Triage Vitals  Enc Vitals Group     BP 07/18/19 1156 (!) 115/92     Pulse Rate 07/18/19 1156 87     Resp 07/18/19 1156 18     Temp 07/18/19 1156 98.2 F (36.8 C)     Temp Source 07/18/19 1156 Oral     SpO2 07/18/19 1156 100 %     Weight 07/18/19 1154 130 lb (59 kg)     Height 07/18/19 1154 5\' 10"  (1.778 m)     Head Circumference --      Peak Flow --      Pain Score 07/18/19 1154 0     Pain Loc --      Pain Edu? --      Excl. in GC? --     Updated Vital Signs BP (!) 115/92 (BP Location: Right Arm)   Pulse 87   Temp 98.2 F (36.8 C) (Oral)   Resp 18   Ht 5\' 10"  (1.778 m)   Wt 59 kg   SpO2 100%   BMI 18.65 kg/m   Visual Acuity Right Eye Distance:   Left Eye Distance:   Bilateral Distance:    Right Eye Near:   Left Eye Near:    Bilateral Near:     Physical Exam Vitals and nursing note reviewed.  Constitutional:      General: He is not in acute distress.    Appearance: Normal appearance. He is not ill-appearing.  HENT:     Head: Normocephalic and atraumatic.  Eyes:     General:        Right eye: No discharge.        Left eye: No discharge.     Conjunctiva/sclera: Conjunctivae normal.  Cardiovascular:     Rate and Rhythm: Normal rate and regular rhythm.     Heart sounds: No murmur.  Pulmonary:     Effort: Pulmonary effort is normal.     Breath sounds: Normal breath sounds. No wheezing, rhonchi or rales.  Neurological:     Mental Status: He is alert.  Psychiatric:        Mood and Affect: Mood normal.        Behavior: Behavior normal.    UC Treatments / Results  Labs (all labs ordered are listed, but only abnormal results are displayed) Labs Reviewed - No data to display  EKG   Radiology No results found.  Procedures Procedures (including critical care time)  Medications Ordered in UC Medications - No data to display  Initial Impression / Assessment and Plan / UC Course  I have reviewed the triage vital signs and the nursing notes.  Pertinent labs & imaging results that were available during my care of the patient were reviewed by me and considered in my medical decision making (see chart for details).    39 year old male presents with bronchitis.  Suspect that this is the beginnings of chronic bronchitis as he is a current smoker.  Placing on doxycycline and prednisone.  Albuterol as needed.  Supportive care.  Final Clinical Impressions(s) / UC Diagnoses   Final diagnoses:   Bronchitis     Discharge Instructions     Stop smoking.  Medication as prescribed.  Take care  Dr.    ED Prescriptions  Medication Sig Dispense Auth. Provider   doxycycline (VIBRAMYCIN) 100 MG capsule Take 1 capsule (100 mg total) by mouth 2 (two) times daily. 14 capsule Twinkle Sockwell G, DO   predniSONE (DELTASONE) 50 MG tablet 1 tablet daily x 5 days 5 tablet [employer] Null G, DO   albuterol (VENTOLIN HFA) 108 (90 Base) MCG/ACT inhaler Inhale 1-2 puffs into the lungs every 6 (six) hours as needed for wheezing or shortness of breath. 18 g Tommie Sams, DO     PDMP not reviewed this encounter.   Tommie Sams, [place] 07/18/19 1308

## 2019-10-13 DEATH — deceased

## 2019-12-31 IMAGING — CR DG ABDOMEN 1V
2 series · 2 of 2 positions shown · non-contrast
Comparison: None.

CLINICAL DATA: 38-year-old male with abdominal pain

EXAM:
ABDOMEN - 1 VIEW

[abdomen kub (1 of 2)]
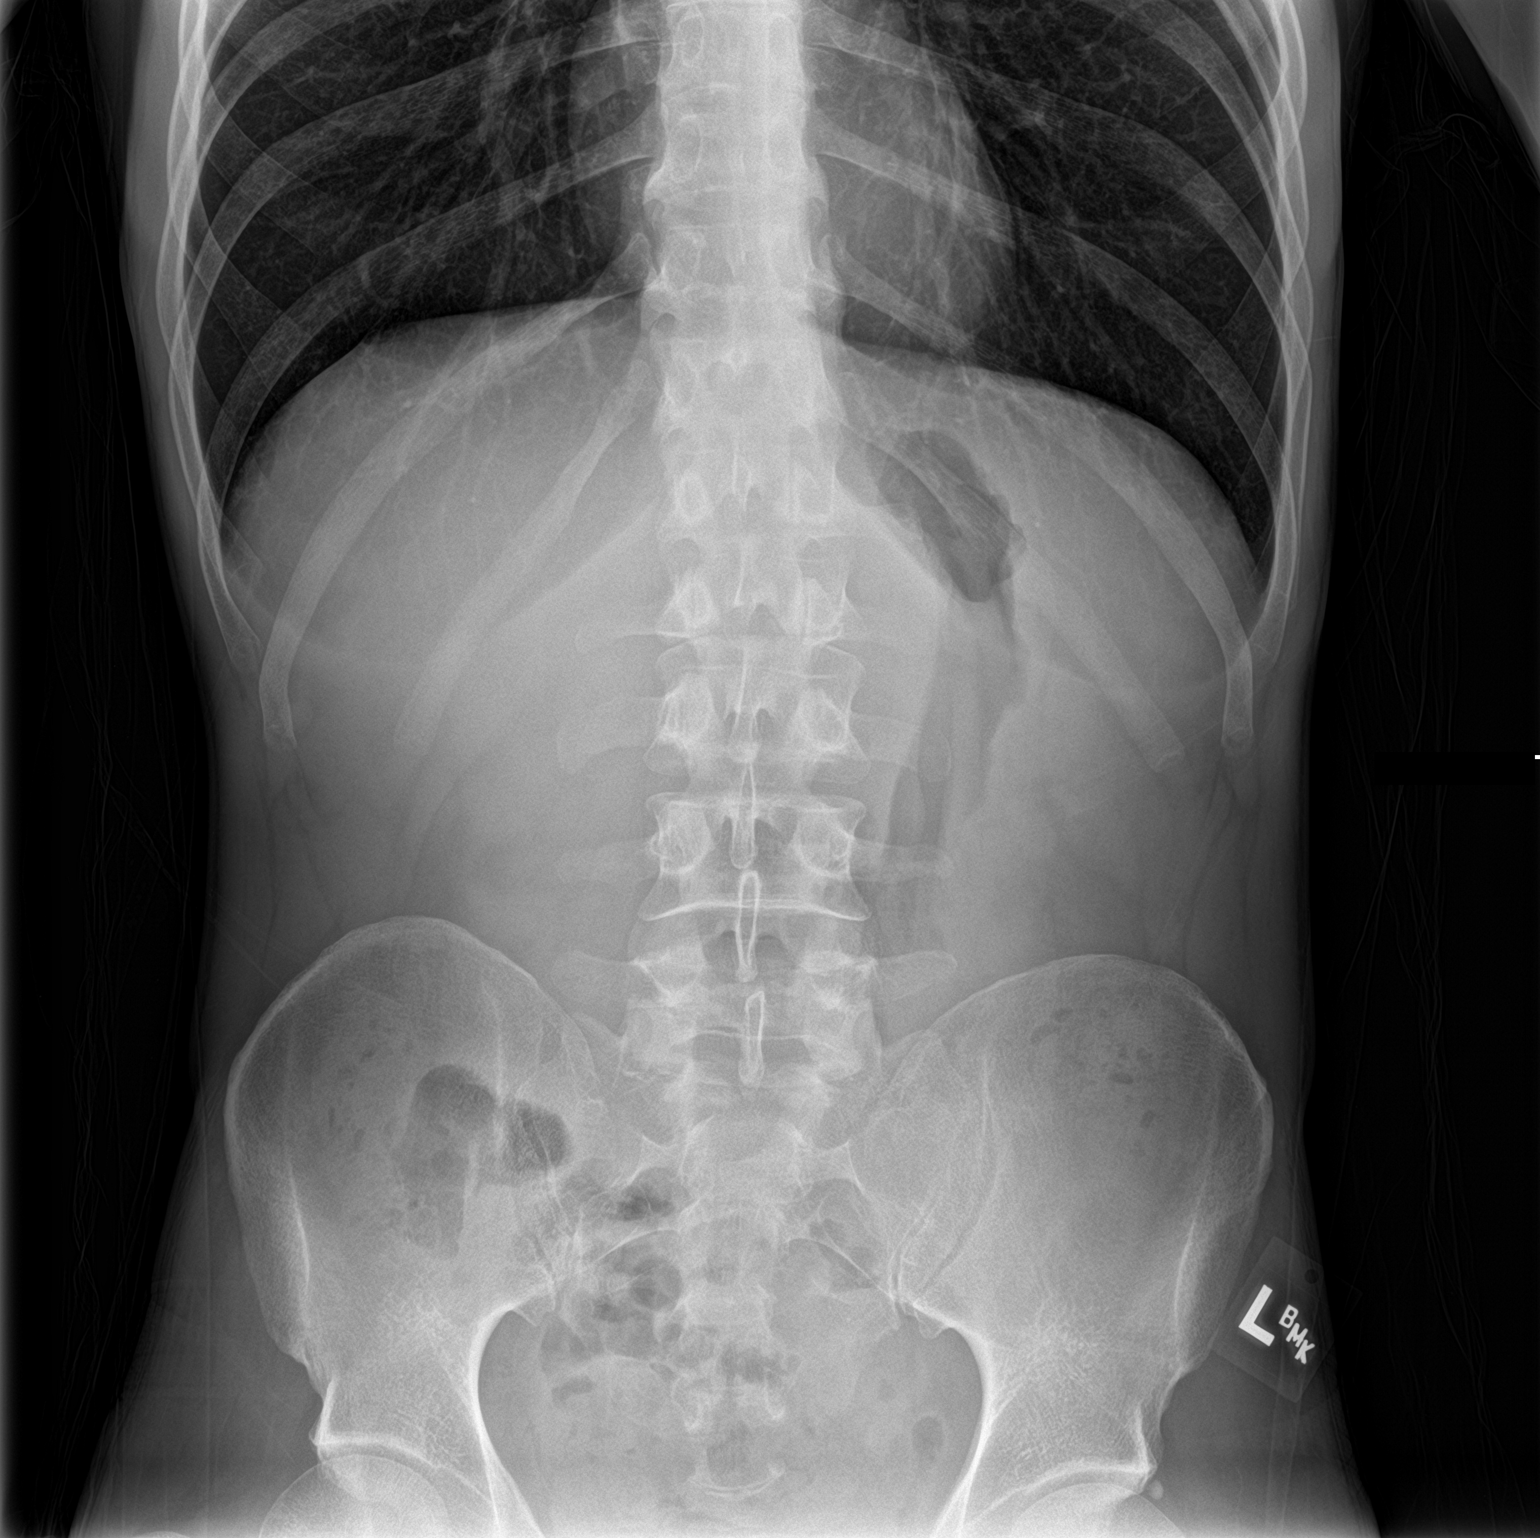

[abdomen kub (2 of 2)]
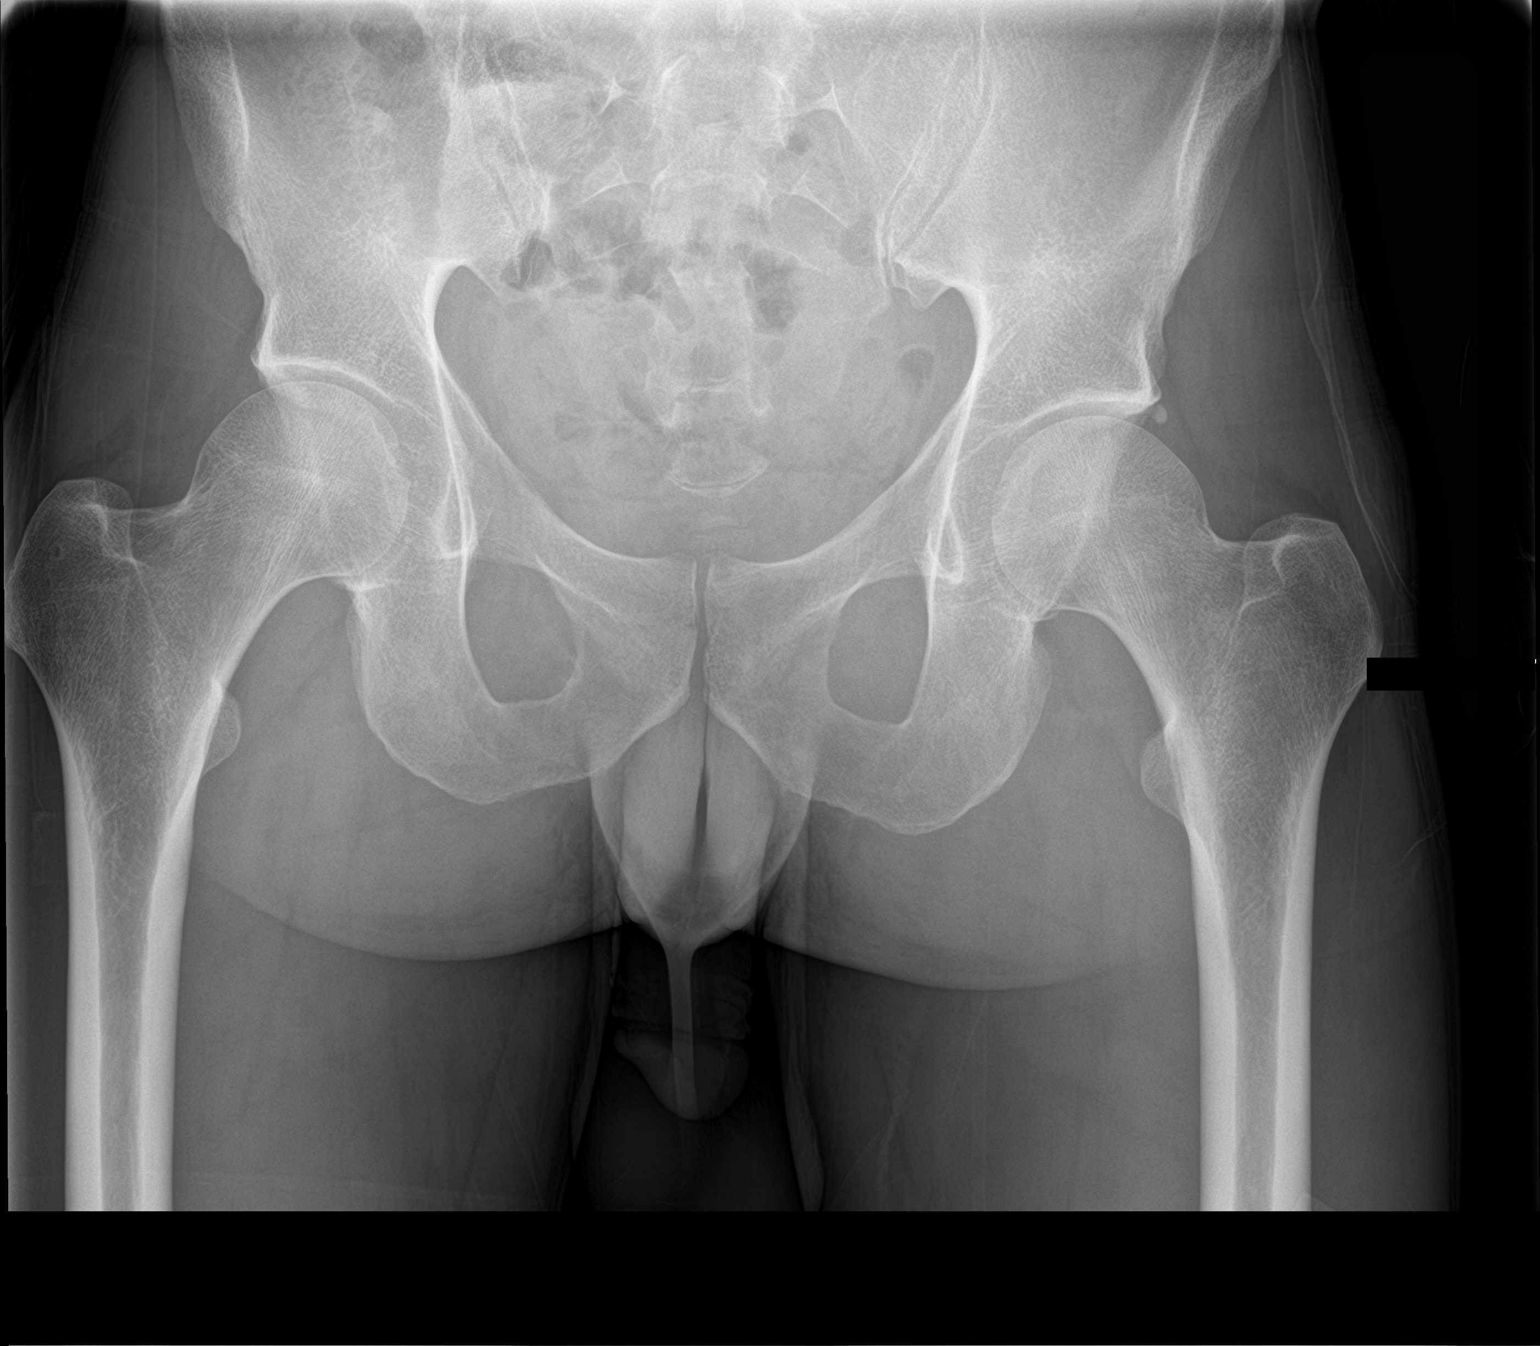

[2 of 2 positions shown; findings below may reference images not displayed]

FINDINGS: The bowel gas pattern is normal. No radio-opaque calculi or other
significant radiographic abnormality are seen.
IMPRESSION: Negative.

## 2019-12-31 IMAGING — CT CT ABD-PELV W/ CM
2 of 4 series · 16 of 46 positions shown, 18 images · IV contrast (APPLIED)
Comparison: 04/09/2013

CLINICAL DATA: Generalized abdominal pain

EXAM:
CT ABDOMEN AND PELVIS WITH CONTRAST
TECHNIQUE: Multidetector CT imaging of the abdomen and pelvis was performed
using the standard protocol following bolus administration of
intravenous contrast.
CONTRAST:  100mL OMNIPAQUE IOHEXOL 300 MG/ML  SOLN

[Series 2: routine abd/pel with · axial · 0.66mm/px · z∈[-493,-63]mm · 13 of 94 slices shown, 15 images]
[im 4/94  soft-tissue]
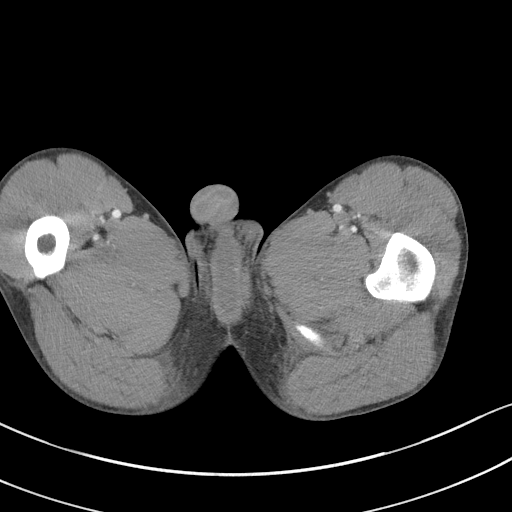
[im 4/94  bone]
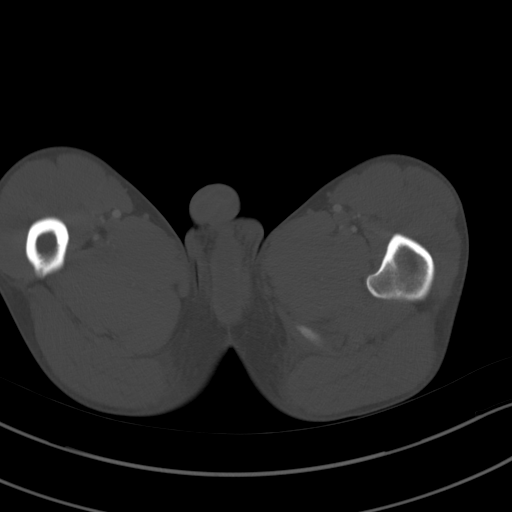
[im 12/94  soft-tissue]
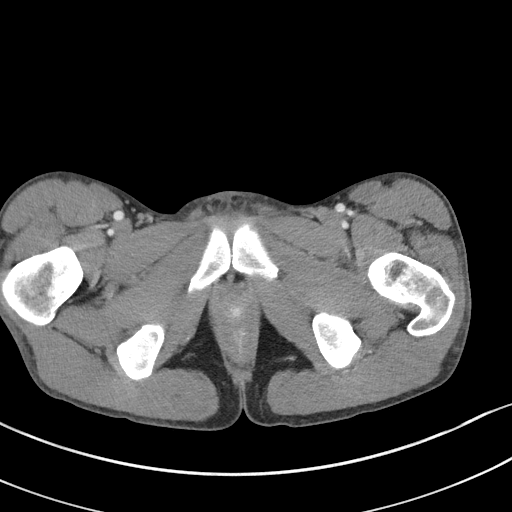
[im 20/94  soft-tissue]
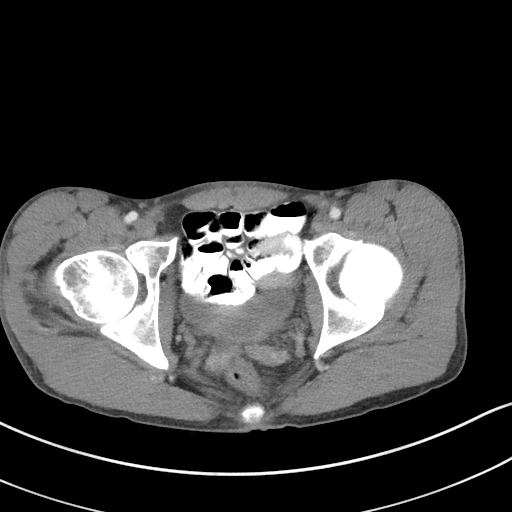
[im 28/94  soft-tissue]
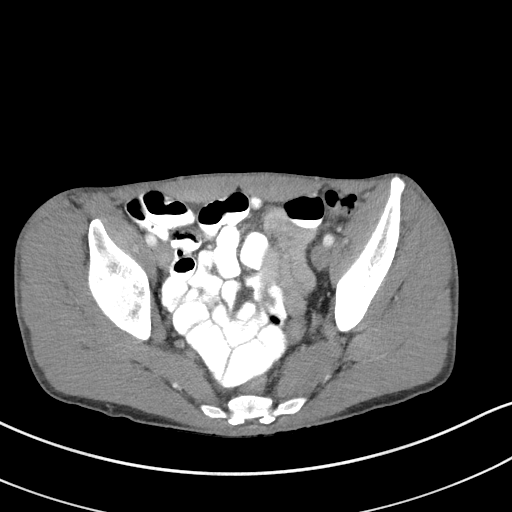
[im 32/94  soft-tissue]
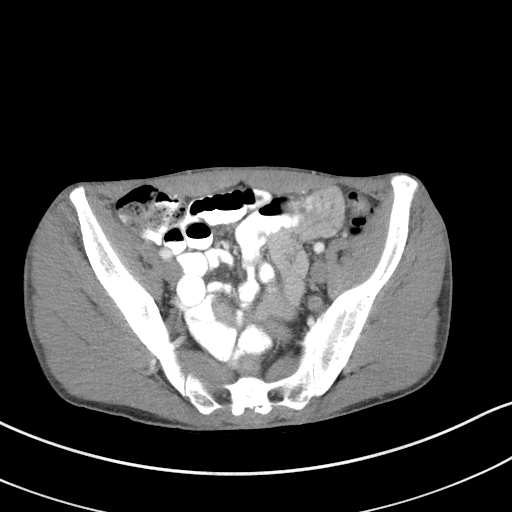
[im 39/94  soft-tissue]
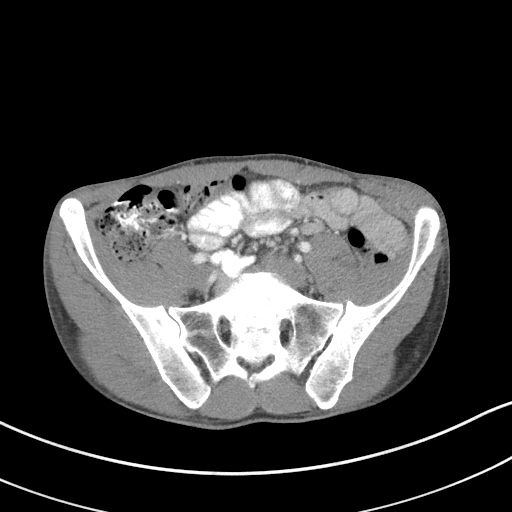
[im 47/94  soft-tissue]
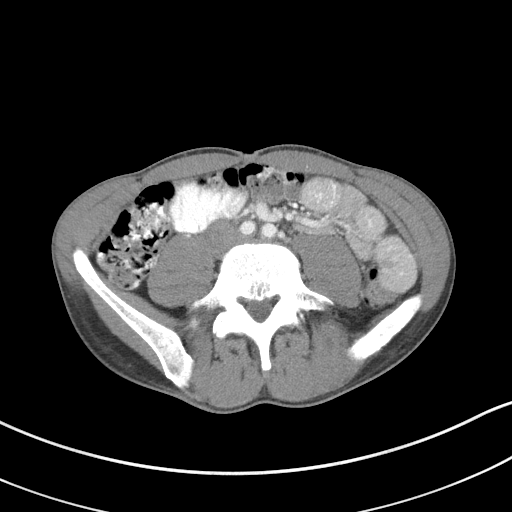
[im 55/94  soft-tissue]
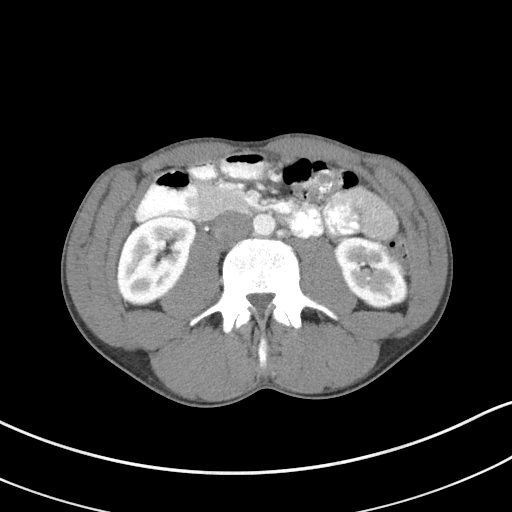
[im 63/94  soft-tissue]
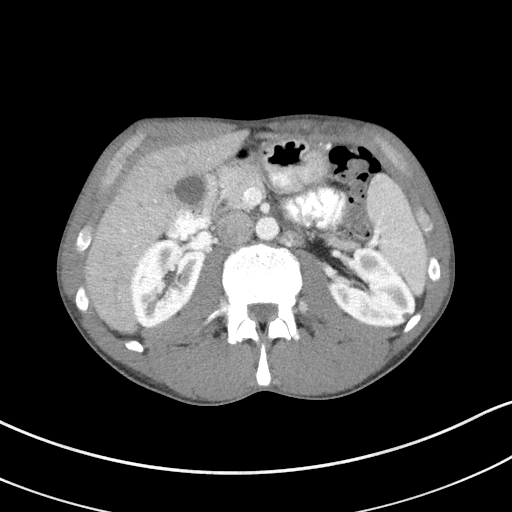
[im 63/94  bone]
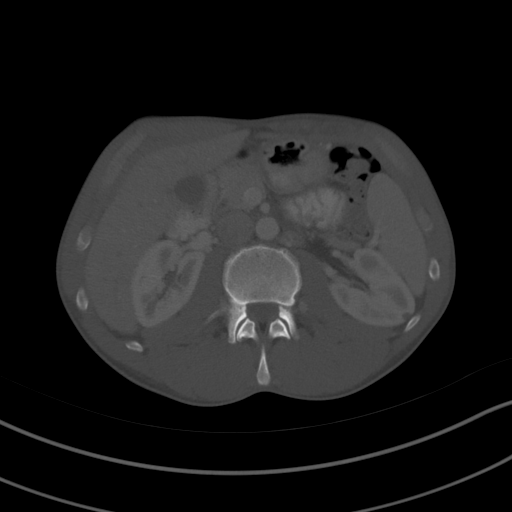
[im 66/94  soft-tissue]
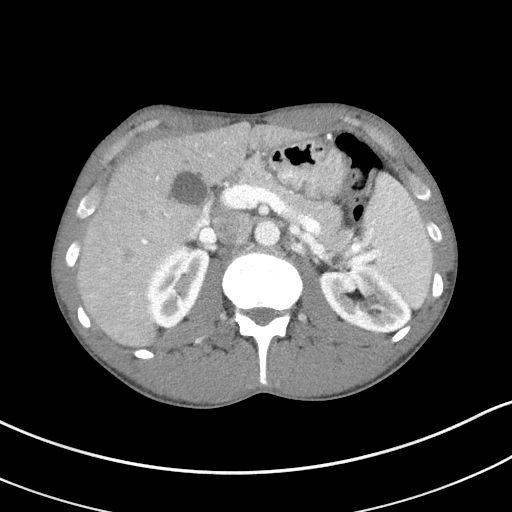
[im 74/94  soft-tissue]
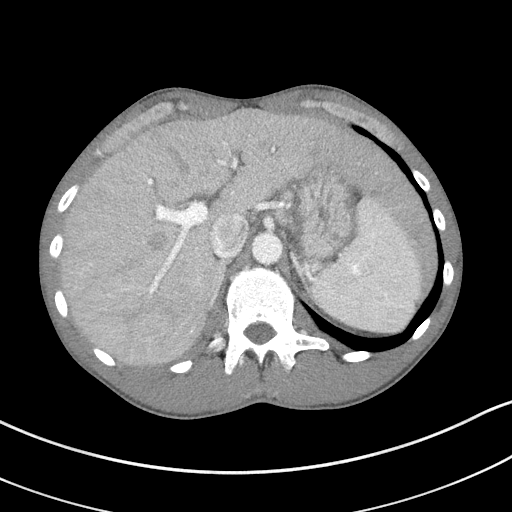
[im 82/94  soft-tissue]
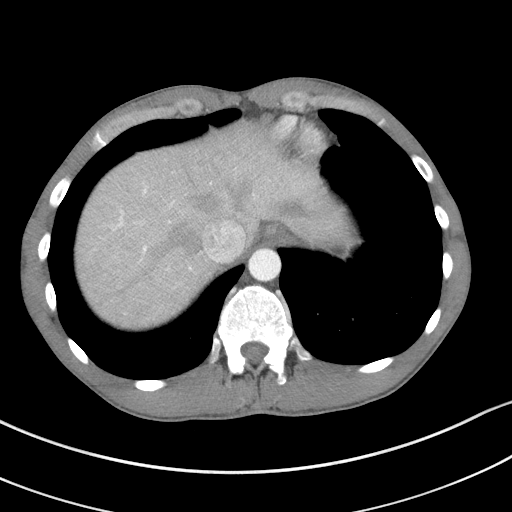
[im 90/94  soft-tissue]
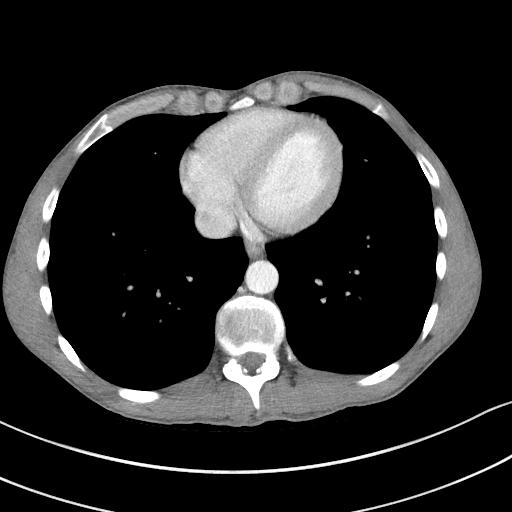

[Series 5: coronal st · coronal · 0.68mm/px · 3 of 78 slices shown]
[im 26/78  soft-tissue]
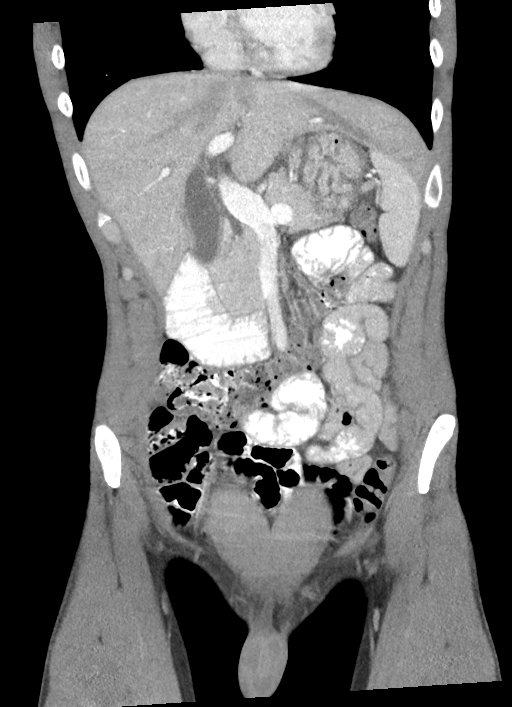
[im 35/78  soft-tissue]
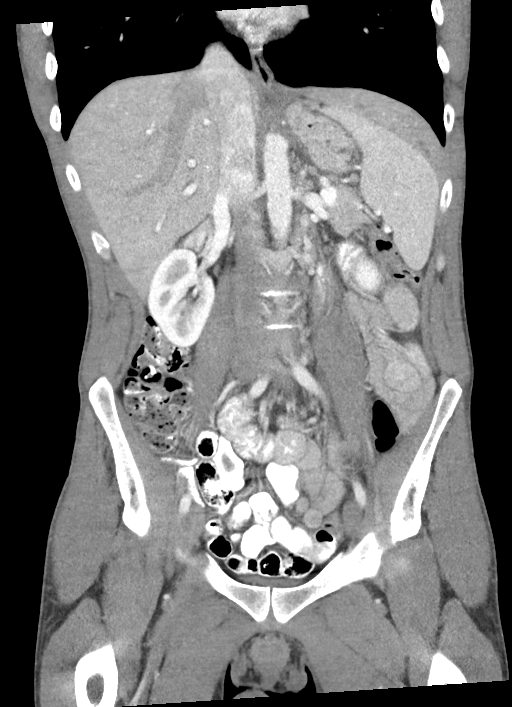
[im 43/78  soft-tissue]
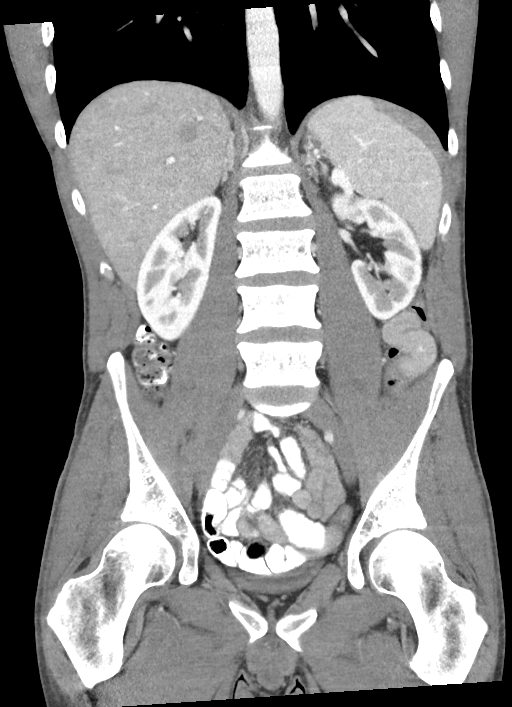

[16 of 46 positions shown; findings below may reference images not displayed]

FINDINGS: Lower chest: Lung bases are clear. No effusions. Heart is normal
size.

Hepatobiliary: No focal hepatic abnormality. Gallbladder
unremarkable.

Pancreas: No focal abnormality or ductal dilatation.

Spleen: No focal abnormality.  Normal size.

Adrenals/Urinary Tract: Small cortical cysts in the left kidney. No
stones or hydronephrosis. Adrenal glands and urinary bladder
unremarkable.

Stomach/Bowel: Stomach, large and small bowel grossly unremarkable.
Normal appendix.

Vascular/Lymphatic: No evidence of aneurysm or adenopathy.

Reproductive: No visible focal abnormality.

Other: Trace free fluid in the pelvis.  No free air.

Musculoskeletal: No acute bony abnormality.
IMPRESSION: Normal appendix.

Trace free fluid in the pelvis.  Exact cause not identified.
